# Patient Record
Sex: Male | Born: 1935 | Race: Black or African American | Hispanic: No | Marital: Married | Smoking: Never smoker
Health system: Southern US, Community
[De-identification: ages and names within clinical notes are randomized; demographics above are authoritative.]

## PROBLEM LIST (undated history)

## (undated) DIAGNOSIS — E119 Type 2 diabetes mellitus without complications: Secondary | ICD-10-CM

## (undated) DIAGNOSIS — I10 Essential (primary) hypertension: Secondary | ICD-10-CM

## (undated) DIAGNOSIS — E7800 Pure hypercholesterolemia, unspecified: Secondary | ICD-10-CM

## (undated) DIAGNOSIS — I2510 Atherosclerotic heart disease of native coronary artery without angina pectoris: Secondary | ICD-10-CM

## (undated) HISTORY — PX: CARPAL TUNNEL RELEASE: SHX101

## (undated) HISTORY — PX: CORONARY ARTERY BYPASS GRAFT: SHX141

## (undated) HISTORY — PX: KNEE SURGERY: SHX244

## (undated) HISTORY — PX: CHOLECYSTECTOMY: SHX55

## (undated) HISTORY — PX: CAROTID ARTERY ANGIOPLASTY: SHX1300

---

## 2014-11-16 DIAGNOSIS — I259 Chronic ischemic heart disease, unspecified: Secondary | ICD-10-CM | POA: Diagnosis present

## 2015-01-28 DIAGNOSIS — Z953 Presence of xenogenic heart valve: Secondary | ICD-10-CM

## 2015-01-29 DIAGNOSIS — Z8673 Personal history of transient ischemic attack (TIA), and cerebral infarction without residual deficits: Secondary | ICD-10-CM

## 2015-03-04 DIAGNOSIS — E119 Type 2 diabetes mellitus without complications: Secondary | ICD-10-CM

## 2015-03-04 DIAGNOSIS — I10 Essential (primary) hypertension: Secondary | ICD-10-CM | POA: Diagnosis present

## 2017-10-22 DIAGNOSIS — G4733 Obstructive sleep apnea (adult) (pediatric): Secondary | ICD-10-CM | POA: Diagnosis present

## 2021-02-26 ENCOUNTER — Emergency Department (HOSPITAL_BASED_OUTPATIENT_CLINIC_OR_DEPARTMENT_OTHER): Payer: Medicare HMO

## 2021-02-26 ENCOUNTER — Encounter (HOSPITAL_BASED_OUTPATIENT_CLINIC_OR_DEPARTMENT_OTHER): Payer: Self-pay | Admitting: *Deleted

## 2021-02-26 ENCOUNTER — Other Ambulatory Visit: Payer: Self-pay

## 2021-02-26 ENCOUNTER — Emergency Department (HOSPITAL_BASED_OUTPATIENT_CLINIC_OR_DEPARTMENT_OTHER)
Admission: EM | Admit: 2021-02-26 | Discharge: 2021-02-26 | Disposition: A | Discharge status: Home / Self Care | Payer: Medicare HMO | Attending: Emergency Medicine | Admitting: Emergency Medicine

## 2021-02-26 DIAGNOSIS — J181 Lobar pneumonia, unspecified organism: Secondary | ICD-10-CM | POA: Insufficient documentation

## 2021-02-26 DIAGNOSIS — R519 Headache, unspecified: Secondary | ICD-10-CM | POA: Diagnosis not present

## 2021-02-26 DIAGNOSIS — I10 Essential (primary) hypertension: Secondary | ICD-10-CM | POA: Insufficient documentation

## 2021-02-26 DIAGNOSIS — Z79899 Other long term (current) drug therapy: Secondary | ICD-10-CM | POA: Insufficient documentation

## 2021-02-26 DIAGNOSIS — Z20822 Contact with and (suspected) exposure to covid-19: Secondary | ICD-10-CM | POA: Diagnosis not present

## 2021-02-26 DIAGNOSIS — Z7984 Long term (current) use of oral hypoglycemic drugs: Secondary | ICD-10-CM | POA: Diagnosis not present

## 2021-02-26 DIAGNOSIS — Z7982 Long term (current) use of aspirin: Secondary | ICD-10-CM | POA: Insufficient documentation

## 2021-02-26 DIAGNOSIS — E119 Type 2 diabetes mellitus without complications: Secondary | ICD-10-CM | POA: Insufficient documentation

## 2021-02-26 DIAGNOSIS — R030 Elevated blood-pressure reading, without diagnosis of hypertension: Secondary | ICD-10-CM | POA: Diagnosis present

## 2021-02-26 DIAGNOSIS — J189 Pneumonia, unspecified organism: Secondary | ICD-10-CM

## 2021-02-26 DIAGNOSIS — Z955 Presence of coronary angioplasty implant and graft: Secondary | ICD-10-CM | POA: Insufficient documentation

## 2021-02-26 DIAGNOSIS — R051 Acute cough: Secondary | ICD-10-CM

## 2021-02-26 HISTORY — DX: Pure hypercholesterolemia, unspecified: E78.00

## 2021-02-26 HISTORY — DX: Essential (primary) hypertension: I10

## 2021-02-26 HISTORY — DX: Type 2 diabetes mellitus without complications: E11.9

## 2021-02-26 LAB — CBC WITH DIFFERENTIAL/PLATELET
Abs Immature Granulocytes: 0.04 10*3/uL (ref 0.00–0.07)
Basophils Absolute: 0.1 10*3/uL (ref 0.0–0.1)
Basophils Relative: 0 %
Eosinophils Absolute: 0.2 10*3/uL (ref 0.0–0.5)
Eosinophils Relative: 2 %
HCT: 39.9 % (ref 39.0–52.0)
Hemoglobin: 13.2 g/dL (ref 13.0–17.0)
Immature Granulocytes: 0 %
Lymphocytes Relative: 16 %
Lymphs Abs: 1.9 10*3/uL (ref 0.7–4.0)
MCH: 32.1 pg (ref 26.0–34.0)
MCHC: 33.1 g/dL (ref 30.0–36.0)
MCV: 97.1 fL (ref 80.0–100.0)
Monocytes Absolute: 1 10*3/uL (ref 0.1–1.0)
Monocytes Relative: 9 %
Neutro Abs: 8.4 10*3/uL — ABNORMAL HIGH (ref 1.7–7.7)
Neutrophils Relative %: 73 %
Platelets: 134 10*3/uL — ABNORMAL LOW (ref 150–400)
RBC: 4.11 MIL/uL — ABNORMAL LOW (ref 4.22–5.81)
RDW: 14.2 % (ref 11.5–15.5)
WBC: 11.6 10*3/uL — ABNORMAL HIGH (ref 4.0–10.5)
nRBC: 0 % (ref 0.0–0.2)

## 2021-02-26 LAB — COMPREHENSIVE METABOLIC PANEL
ALT: 11 U/L (ref 0–44)
AST: 23 U/L (ref 15–41)
Albumin: 3.8 g/dL (ref 3.5–5.0)
Alkaline Phosphatase: 58 U/L (ref 38–126)
Anion gap: 7 (ref 5–15)
BUN: 16 mg/dL (ref 8–23)
CO2: 26 mmol/L (ref 22–32)
Calcium: 9 mg/dL (ref 8.9–10.3)
Chloride: 104 mmol/L (ref 98–111)
Creatinine, Ser: 1.26 mg/dL — ABNORMAL HIGH (ref 0.61–1.24)
GFR, Estimated: 54 mL/min — ABNORMAL LOW (ref >60)
Glucose, Bld: 91 mg/dL (ref 70–99)
Potassium: 4 mmol/L (ref 3.5–5.1)
Sodium: 137 mmol/L (ref 135–145)
Total Bilirubin: 0.5 mg/dL (ref 0.3–1.2)
Total Protein: 7 g/dL (ref 6.5–8.1)

## 2021-02-26 LAB — RESP PANEL BY RT-PCR (FLU A&B, COVID) ARPGX2
Influenza A by PCR: NEGATIVE
Influenza B by PCR: NEGATIVE
SARS Coronavirus 2 by RT PCR: NEGATIVE

## 2021-02-26 LAB — TROPONIN I (HIGH SENSITIVITY): Troponin I (High Sensitivity): 10 ng/L (ref <18)

## 2021-02-26 MED ORDER — AZITHROMYCIN 250 MG PO TABS: 250.0000 mg | ORAL_TABLET | Freq: Every day | ORAL | 0 refills | Status: DC

## 2021-02-26 MED ORDER — AMOXICILLIN-POT CLAVULANATE 875-125 MG PO TABS: 1.0000 | ORAL_TABLET | Freq: Two times a day (BID) | ORAL | 0 refills | Status: DC

## 2021-02-26 MED ORDER — AMOXICILLIN-POT CLAVULANATE 875-125 MG PO TABS
1.0000 | ORAL_TABLET | Freq: Once | ORAL | Status: AC
  Administered 2021-02-26: 1 via ORAL
  Filled 2021-02-26: qty 1

## 2021-02-26 MED ORDER — BENZONATATE 100 MG PO CAPS: 100.0000 mg | ORAL_CAPSULE | Freq: Three times a day (TID) | ORAL | 0 refills | Status: DC

## 2021-02-26 MED ORDER — AZITHROMYCIN 250 MG PO TABS
500.0000 mg | ORAL_TABLET | Freq: Once | ORAL | Status: AC
  Administered 2021-02-26: 500 mg via ORAL
  Filled 2021-02-26: qty 2

## 2021-02-26 NOTE — ED Triage Notes (Signed)
He woke with a headache this am. His wife gave him aspirin 325mg  with some relief. This afternoon his head started to hurt again. She took his BP and it was elevated. He had a change in his BP medication swelling of his lips 3 months ago and was never started on a new medication.

## 2021-02-26 NOTE — ED Provider Notes (Signed)
[facility] Provider Note   CSN: [id number] Arrival date & time: 02/26/21  1925     History Chief Complaint  Patient presents with   Hypertension    Norman Hart is a 87 y.o. male.  HPI     Headache began this morning, woke up with it, both temples, 6/10, dull Just had total knee replacement, using cane Denies numbness, weakness, difficulty talking or walking, visual changes or facial droop.  No nausea, vomiting No fever Stopped lisinopril a few months ago, did not restart anything else  Past Medical History:  Diagnosis Date   Diabetes mellitus without complication (HCC)    High cholesterol    Hypertension     There are no problems to display for this patient.   Past Surgical History:  Procedure Laterality Date   CAROTID ARTERY ANGIOPLASTY     CARPAL TUNNEL RELEASE     CORONARY ARTERY BYPASS GRAFT     KNEE SURGERY         No family history on file.  Social History   Tobacco Use   Smoking status: Never   Smokeless tobacco: Never  Substance Use Topics   Alcohol use: Never   Drug use: Never    Home Medications Prior to Admission medications   Medication Sig Start Date End Date Taking? Authorizing Provider  albuterol (VENTOLIN HFA) 108 (90 Base) MCG/ACT inhaler Inhale 2 puffs into the lungs every 4 (four) hours as needed. 12/02/19  Yes [provider]  amoxicillin-clavulanate (AUGMENTIN) 875-125 MG tablet Take 1 tablet by mouth every 12 (twelve) hours. 02/26/21  Yes Gareth Morgan, MD  aspirin 325 MG EC tablet Take by mouth. 12/20/20  Yes [provider]  azithromycin (ZITHROMAX) 250 MG tablet Take 1 tablet (250 mg total) by mouth daily. Take first 2 tablets together, then 1 every day until finished. 02/26/21  Yes Gareth Morgan, MD  benzonatate (TESSALON) 100 MG capsule Take 1 capsule (100 mg total) by mouth every 8 (eight) hours. 02/26/21  Yes Gareth Morgan, MD  ipratropium (ATROVENT) 0.06 % nasal  spray Place into the nose. 02/11/21 05/12/21 Yes [provider]  latanoprost (XALATAN) 0.005 % ophthalmic solution Place 1 drop into both eyes nightly. 07/13/20  Yes [provider]  metFORMIN (GLUCOPHAGE) 500 MG tablet TAKE 1 TABLET BY MOUTH TWICE DAILY FOR DIABETES 03/04/15  Yes [provider]  metoprolol tartrate (LOPRESSOR) 50 MG tablet Take by mouth. 07/24/20  Yes [provider]  rosuvastatin (CRESTOR) 40 MG tablet Take 1 tablet by mouth daily. 01/04/21  Yes [provider]    Allergies    Patient has no known allergies.  Review of Systems   Review of Systems  Constitutional:  Negative for fever.  HENT:  Positive for congestion and rhinorrhea. Negative for sore throat.   Eyes:  Negative for visual disturbance.  Respiratory:  Positive for cough. Negative for shortness of breath.   Cardiovascular:  Positive for chest pain (had heaviness on and off today, has had similar discomfor in the past, not worse with exertion or coughing, 4/10).  Gastrointestinal:  Negative for abdominal pain, diarrhea, nausea and vomiting.  Genitourinary:  Negative for dysuria.  Skin:  Negative for rash.  Neurological:  Positive for headaches. Negative for syncope, facial asymmetry, speech difficulty, weakness and numbness.   Physical Exam Updated Vital Signs BP (!) 161/71   Pulse 100   Temp 98.1 F (36.7 C) (Oral)   Resp 15   Ht 5\' 4"  (1.626  m)   Wt 68.5 kg   SpO2 100%   BMI 25.92 kg/m   Physical Exam Vitals and nursing note reviewed.  Constitutional:      General: He is not in acute distress.    Appearance: Normal appearance. He is well-developed. He is not ill-appearing or diaphoretic.  HENT:     Head: Normocephalic and atraumatic.  Eyes:     General: No visual field deficit.    Extraocular Movements: Extraocular movements intact.     Conjunctiva/sclera: Conjunctivae normal.     Pupils: Pupils are equal, round, and reactive to light.   Cardiovascular:     Rate and Rhythm: Normal rate and regular rhythm.     Pulses: Normal pulses.     Heart sounds: Normal heart sounds. No murmur heard.   No friction rub. No gallop.  Pulmonary:     Effort: Pulmonary effort is normal. No respiratory distress.     Breath sounds: Normal breath sounds. No wheezing or rales.  Abdominal:     General: There is no distension.     Palpations: Abdomen is soft.     Tenderness: There is no abdominal tenderness. There is no guarding.  Musculoskeletal:        General: No swelling or tenderness.     Cervical back: Normal range of motion.  Skin:    General: Skin is warm and dry.     Findings: No erythema or rash.  Neurological:     General: No focal deficit present.     Mental Status: He is alert and oriented to person, place, and time.     GCS: GCS eye subscore is 4. GCS verbal subscore is 5. GCS motor subscore is 6.     Cranial Nerves: No cranial nerve deficit, dysarthria or facial asymmetry.     Sensory: No sensory deficit.     Motor: No weakness or tremor.     Coordination: Coordination normal. Finger-Nose-Finger Test normal.     Gait: Gait normal.    ED Results / Procedures / Treatments   Labs (all labs ordered are listed, but only abnormal results are displayed) Labs Reviewed  CBC WITH DIFFERENTIAL/PLATELET - Abnormal; Notable for the following components:      Result Value   WBC 11.6 (*)    RBC 4.11 (*)    Platelets 134 (*)    Neutro Abs 8.4 (*)    All other components within normal limits  COMPREHENSIVE METABOLIC PANEL - Abnormal; Notable for the following components:   Creatinine, Ser 1.26 (*)    GFR, Estimated 54 (*)    All other components within normal limits  RESP PANEL BY RT-PCR (FLU A&B, COVID) ARPGX2  TROPONIN I (HIGH SENSITIVITY)    EKG EKG Interpretation  Date/Time:  Tuesday February 26 2021 19:52:29 EST Ventricular Rate:  92 PR Interval:  181 QRS Duration: 77 QT Interval:  354 QTC Calculation: 438 R  Axis:   57 Text Interpretation: Sinus rhythm Abnormal R-wave progression, early transition Consider left ventricular hypertrophy No previous ECGs available Confirmed by Alvira Monday ([id number]) on 02/26/2021 8:13:56 PM  Radiology CT Head Wo Contrast  Result Date: 02/26/2021 CLINICAL DATA:  Headaches for several hours, initial encounter EXAM: CT HEAD WITHOUT CONTRAST TECHNIQUE: Contiguous axial images were obtained from the base of the skull through the vertex without intravenous contrast. COMPARISON:  08/15/2015 FINDINGS: Brain: No evidence of acute infarction, hemorrhage, hydrocephalus, extra-axial collection or mass lesion/mass effect. Chronic atrophic and ischemic changes are noted commensurate with the  patient's given age. Vascular: No hyperdense vessel or unexpected calcification. Skull: Normal. Negative for fracture or focal lesion. Sinuses/Orbits: Sinuses are well aerated. Some mucosal thickening in the ethmoid and sphenoid sinuses is seen. No acute abnormality is noted. Other: None. IMPRESSION: Chronic atrophic and ischemic changes stable from the prior exam. Mucosal thickening in the paranasal sinuses without air-fluid level. Electronically Signed   By: Inez Catalina M.D.   On: 02/26/2021 21:15   DG Chest Portable 1 View  Result Date: 02/26/2021 CLINICAL DATA:  Cough and chest pain EXAM: PORTABLE CHEST 1 VIEW COMPARISON:  01/02/2021 FINDINGS: Unchanged chronic elevation of the left hemidiaphragm with increased left basilar atelectasis. Right lung is clear. Cardiomediastinal contours are normal. Remote median sternotomy. IMPRESSION: Chronic elevation of the left hemidiaphragm with increased left basilar atelectasis. Electronically Signed   By: Ulyses Jarred M.D.   On: 02/26/2021 21:06    Procedures Procedures   Medications Ordered in ED Medications  amoxicillin-clavulanate (AUGMENTIN) 875-125 MG per tablet 1 tablet (1 tablet Oral Given 02/26/21 2252)  azithromycin (ZITHROMAX) tablet 500 mg  (500 mg Oral Given 02/26/21 2252)    ED Course  I have reviewed the triage vital signs and the nursing notes.  Pertinent labs & imaging results that were available during my care of the patient were reviewed by me and considered in my medical decision making (see chart for details).    MDM Rules/Calculators/A&P                           87yo male with history of hypertension, DM, hyperlipidemia, presents with concern for elevated blood pressure, also reports headache, chest congestion and cough.  CT head without acute abnormalities.  Slow onset, low suspicion for occult SAH. Neurologic exam normal and doubt CVA.  Had CP earlier today, troponin negative, doubt ACS given timing of symptoms. Denies dyspnea, doubt PE. When reevaluated stated it was more the chest congestion and cough. Not having exertional symptoms.  Having more persistent coughing while in ED, WBC slightly elevated, XR with area that might represent atelectasis however given worsening cough I suspect it may be pneumonia. COVID/flu testing negative. Given rx for antibiotics and tessalon pearls. BP improved, do not suspect symptoms represent CHF/hypertensive emergency. Given concern for infection, will not initiate new BP medication at this time but recommend close follow up with PCP.  Patient discharged in stable condition with understanding of reasons to return.    Final Clinical Impression(s) / ED Diagnoses Final diagnoses:  Hypertension, unspecified type  Community acquired pneumonia of left lower lobe of lung  Acute cough    Rx / DC Orders ED Discharge Orders          Ordered    amoxicillin-clavulanate (AUGMENTIN) 875-125 MG tablet  Every 12 hours        02/26/21 2241    azithromycin (ZITHROMAX) 250 MG tablet  Daily        02/26/21 2241    benzonatate (TESSALON) 100 MG capsule  Every 8 hours        02/26/21 2243             Gareth Morgan, MD 02/27/21 2335

## 2021-06-08 ENCOUNTER — Other Ambulatory Visit: Payer: Self-pay

## 2021-06-08 ENCOUNTER — Emergency Department (HOSPITAL_BASED_OUTPATIENT_CLINIC_OR_DEPARTMENT_OTHER): Payer: Medicare HMO

## 2021-06-08 ENCOUNTER — Emergency Department (HOSPITAL_BASED_OUTPATIENT_CLINIC_OR_DEPARTMENT_OTHER)
Admission: EM | Admit: 2021-06-08 | Discharge: 2021-06-09 | Disposition: A | Discharge status: Home / Self Care | Payer: Medicare HMO | Attending: Emergency Medicine | Admitting: Emergency Medicine

## 2021-06-08 ENCOUNTER — Encounter (HOSPITAL_BASED_OUTPATIENT_CLINIC_OR_DEPARTMENT_OTHER): Payer: Self-pay | Admitting: Emergency Medicine

## 2021-06-08 DIAGNOSIS — J069 Acute upper respiratory infection, unspecified: Secondary | ICD-10-CM | POA: Diagnosis not present

## 2021-06-08 DIAGNOSIS — Z20822 Contact with and (suspected) exposure to covid-19: Secondary | ICD-10-CM | POA: Diagnosis not present

## 2021-06-08 DIAGNOSIS — E119 Type 2 diabetes mellitus without complications: Secondary | ICD-10-CM | POA: Diagnosis not present

## 2021-06-08 DIAGNOSIS — Z79899 Other long term (current) drug therapy: Secondary | ICD-10-CM | POA: Insufficient documentation

## 2021-06-08 DIAGNOSIS — Z7982 Long term (current) use of aspirin: Secondary | ICD-10-CM | POA: Insufficient documentation

## 2021-06-08 DIAGNOSIS — Z7984 Long term (current) use of oral hypoglycemic drugs: Secondary | ICD-10-CM | POA: Diagnosis not present

## 2021-06-08 DIAGNOSIS — I10 Essential (primary) hypertension: Secondary | ICD-10-CM | POA: Insufficient documentation

## 2021-06-08 DIAGNOSIS — I2510 Atherosclerotic heart disease of native coronary artery without angina pectoris: Secondary | ICD-10-CM | POA: Diagnosis not present

## 2021-06-08 DIAGNOSIS — R0981 Nasal congestion: Secondary | ICD-10-CM | POA: Diagnosis present

## 2021-06-08 HISTORY — DX: Atherosclerotic heart disease of native coronary artery without angina pectoris: I25.10

## 2021-06-08 LAB — RESP PANEL BY RT-PCR (FLU A&B, COVID) ARPGX2
Influenza A by PCR: NEGATIVE
Influenza B by PCR: NEGATIVE
SARS Coronavirus 2 by RT PCR: NEGATIVE

## 2021-06-08 LAB — CBC
HCT: 41.8 % (ref 39.0–52.0)
Hemoglobin: 13.8 g/dL (ref 13.0–17.0)
MCH: 31.2 pg (ref 26.0–34.0)
MCHC: 33 g/dL (ref 30.0–36.0)
MCV: 94.6 fL (ref 80.0–100.0)
Platelets: 102 10*3/uL — ABNORMAL LOW (ref 150–400)
RBC: 4.42 MIL/uL (ref 4.22–5.81)
RDW: 13.6 % (ref 11.5–15.5)
WBC: 9.8 10*3/uL (ref 4.0–10.5)
nRBC: 0 % (ref 0.0–0.2)

## 2021-06-08 LAB — BASIC METABOLIC PANEL
Anion gap: 5 (ref 5–15)
BUN: 14 mg/dL (ref 8–23)
CO2: 28 mmol/L (ref 22–32)
Calcium: 8.9 mg/dL (ref 8.9–10.3)
Chloride: 105 mmol/L (ref 98–111)
Creatinine, Ser: 1.37 mg/dL — ABNORMAL HIGH (ref 0.61–1.24)
GFR, Estimated: 49 mL/min — ABNORMAL LOW (ref >60)
Glucose, Bld: 97 mg/dL (ref 70–99)
Potassium: 4.2 mmol/L (ref 3.5–5.1)
Sodium: 138 mmol/L (ref 135–145)

## 2021-06-08 MED ORDER — ACETAMINOPHEN 325 MG PO TABS
650.0000 mg | ORAL_TABLET | Freq: Once | ORAL | Status: AC
  Administered 2021-06-08: 650 mg via ORAL
  Filled 2021-06-08: qty 2

## 2021-06-08 NOTE — ED Provider Notes (Signed)
[facility] EM[facility]GENCY DEPARTMENT Provider Note   CSN: [id number] Arrival date & time: 06/08/21  2225     History  Chief Complaint  Patient presents with   URI    Norman Hart is a 88 y.o. male.   URI Presenting symptoms: congestion    Patient has a history of coronary artery disease, diabetes, hypercholesterol and hypertension.  Patient presents to the ED with complaints of URI type symptoms that started today.  Patient states he has had headache and runny nose.  He has been feeling somewhat short of breath.  He has also had chills although has not measured any fever.  Tried taking some DayQuil at home.  His wife took his blood pressure and it was very elevated.  He is also had a headache and with the elevated blood pressure they decided come to the ED to be evaluated.  Patient denies any leg swelling.  No abdominal pain.  No vomiting or diarrhea.  Home Medications Prior to Admission medications   Medication Sig Start Date End Date Taking? Authorizing Provider  albuterol (VENTOLIN HFA) 108 (90 Base) MCG/ACT inhaler Inhale 2 puffs into the lungs every 4 (four) hours as needed. 12/02/19   [provider]  amoxicillin-clavulanate (AUGMENTIN) 875-125 MG tablet Take 1 tablet by mouth every 12 (twelve) hours. 02/26/21   Alvira Monday, MD  aspirin 325 MG EC tablet Take by mouth. 12/20/20   [provider]  azithromycin (ZITHROMAX) 250 MG tablet Take 1 tablet (250 mg total) by mouth daily. Take first 2 tablets together, then 1 every day until finished. 02/26/21   Alvira Monday, MD  benzonatate (TESSALON) 100 MG capsule Take 1 capsule (100 mg total) by mouth every 8 (eight) hours. 02/26/21   Alvira Monday, MD  ipratropium (ATROVENT) 0.06 % nasal spray Place into the nose. 02/11/21 05/12/21  [provider]  latanoprost (XALATAN) 0.005 % ophthalmic solution Place 1 drop into both eyes nightly. 07/13/20   [provider]  metFORMIN (GLUCOPHAGE) 500  MG tablet TAKE 1 TABLET BY MOUTH TWICE DAILY FOR DIABETES 03/04/15   [provider]  metoprolol tartrate (LOPRESSOR) 50 MG tablet Take by mouth. 07/24/20   [provider]  rosuvastatin (CRESTOR) 40 MG tablet Take 1 tablet by mouth daily. 01/04/21   [provider]      Allergies    Ivp dye [iodinated contrast media]    Review of Systems   Review of Systems  HENT:  Positive for congestion.   Respiratory:  Positive for shortness of breath.   Cardiovascular:  Negative for chest pain.   Physical Exam Updated Vital Signs BP (!) 164/78 (BP Location: Right Arm)    Pulse (!) 109    Temp 98.1 F (36.7 C)    Resp (!) 24    Ht 1.626 m (5\' 4" )    Wt 69.9 kg    SpO2 100%    BMI 26.43 kg/m  Physical Exam Vitals and nursing note reviewed.  Constitutional:      Appearance: He is well-developed. He is not diaphoretic.  HENT:     Head: Normocephalic and atraumatic.     Right Ear: External ear normal.     Left Ear: External ear normal.  Eyes:     General: No scleral icterus.       Right eye: No discharge.        Left eye: No discharge.     Conjunctiva/sclera: Conjunctivae normal.  Neck:     Trachea: No  tracheal deviation.  Cardiovascular:     Rate and Rhythm: Normal rate and regular rhythm.  Pulmonary:     Effort: Pulmonary effort is normal. No respiratory distress.     Breath sounds: Normal breath sounds. No stridor. No wheezing or rales.  Abdominal:     General: Bowel sounds are normal. There is no distension.     Palpations: Abdomen is soft.     Tenderness: There is no abdominal tenderness. There is no guarding or rebound.  Musculoskeletal:        General: No tenderness or deformity.     Cervical back: Neck supple.  Skin:    General: Skin is warm and dry.     Findings: No rash.  Neurological:     General: No focal deficit present.     Mental Status: He is alert.     Cranial Nerves: No cranial nerve deficit (no facial droop, extraocular movements intact,  no slurred speech).     Sensory: No sensory deficit.     Motor: No abnormal muscle tone or seizure activity.     Coordination: Coordination normal.  Psychiatric:        Mood and Affect: Mood normal.    ED Results / Procedures / Treatments   Labs (all labs ordered are listed, but only abnormal results are displayed) Labs Reviewed  RESP PANEL BY RT-PCR (FLU A&B, COVID) ARPGX2  CBC  BASIC METABOLIC PANEL    EKG None  Radiology No results found.  Procedures Procedures    Medications Ordered in ED Medications  acetaminophen (TYLENOL) tablet 650 mg (650 mg Oral Given 06/08/21 2300)    ED Course/ Medical Decision Making/ A&P                           Medical Decision Making Amount and/or Complexity of Data Reviewed Labs: ordered. Radiology: ordered.  Risk OTC drugs.  Will check labs, xray, covid test.   No meningismus.  No sudden onset to suggest vascular etiology. BP elevated at this time but doubt contributing to his headache. Care turned over to Dr Jacqulyn Bath pending tests results.        Final Clinical Impression(s) / ED Diagnoses Final diagnoses:  URI (upper respiratory infection)    Rx / DC Orders ED Discharge Orders     None         Linwood Dibbles, MD 06/08/21 2320

## 2021-06-08 NOTE — ED Triage Notes (Signed)
Pt reports HA, runny nose, SHOB, and chills that started today. Pt taking Dayquil for his sx. Visitor reports HTN at home. BP 164/78 at triage. Rates HA 8/10. Denies fever.

## 2021-06-09 NOTE — ED Provider Notes (Signed)
Blood pressure (!) 159/75, pulse (!) 105, temperature 98.1 F (36.7 C), resp. rate (!) 24, height 5\' 4"  (1.626 m), weight 69.9 kg, SpO2 99 %.  Assuming care from Dr. .  In short, Norman Hart is a 88 y.o. male with a chief complaint of URI .  Refer to the original H&P for additional details.  The current plan of care is to follow up with labs and reassess.  12:07 AM Patient's lab work reviewed.  He has some mild CKD which seems similar to his prior values.  No leukocytosis or anemia.  COVID and flu PCR testing is negative.  I independently interpreted the chest x-ray and agree with radiology interpretation of no active disease.   Patient does have some mild tachycardia as well as elevated blood pressures.  Considered alternate etiologies for symptom such as PE or developing sepsis/severe bacterial infection, however, patient has been taking DayQuil and I suspect this medication may be causing the vital sign abnormalities.  He is looking very well.  No increased work of breathing or confusion.  Feel patient is safe for discharge.  Advised him against using DayQuil and other daytime formulations of cough/cold medicines. Plan for supportive care and PCP follow up in the coming week.     95, MD 06/09/21 [phone]

## 2021-06-09 NOTE — ED Notes (Signed)
Patient discharged to home.  All discharge instructions reviewed.  Patient verbalized understanding via teachback method.  VS WDL.  Respirations even and unlabored.  Wheelchair out of ED.   ?

## 2021-06-09 NOTE — Discharge Instructions (Signed)
You were seen in the emergency department today with upper respiratory infection symptoms.  Your lab work here is reassuring.  Your COVID and flu test are negative.  Your blood work is largely normal.  Your chest x-ray did not show pneumonia.  I would have you avoid using over the counter cold/flu medicines marketed as "daytime" medicine as this often has ingredients that can raise your blood pressure and heart rate.  You may ask the pharmacist to point in the direction of medicines which are safe to take with high blood pressure.  Please follow with your primary care doctor and return with any new or suddenly worsening symptoms.

## 2022-02-08 ENCOUNTER — Other Ambulatory Visit: Payer: Self-pay

## 2022-02-08 ENCOUNTER — Observation Stay (HOSPITAL_BASED_OUTPATIENT_CLINIC_OR_DEPARTMENT_OTHER)
Admission: EM | Admit: 2022-02-08 | Discharge: 2022-02-10 | Disposition: A | Discharge status: Home / Self Care | Payer: Medicare HMO | Attending: Family Medicine | Admitting: Family Medicine

## 2022-02-08 ENCOUNTER — Emergency Department (HOSPITAL_BASED_OUTPATIENT_CLINIC_OR_DEPARTMENT_OTHER): Payer: Medicare HMO

## 2022-02-08 ENCOUNTER — Encounter (HOSPITAL_BASED_OUTPATIENT_CLINIC_OR_DEPARTMENT_OTHER): Payer: Self-pay | Admitting: Emergency Medicine

## 2022-02-08 DIAGNOSIS — I2510 Atherosclerotic heart disease of native coronary artery without angina pectoris: Secondary | ICD-10-CM

## 2022-02-08 DIAGNOSIS — Z951 Presence of aortocoronary bypass graft: Secondary | ICD-10-CM | POA: Insufficient documentation

## 2022-02-08 DIAGNOSIS — Z7984 Long term (current) use of oral hypoglycemic drugs: Secondary | ICD-10-CM | POA: Diagnosis not present

## 2022-02-08 DIAGNOSIS — I5022 Chronic systolic (congestive) heart failure: Secondary | ICD-10-CM | POA: Diagnosis not present

## 2022-02-08 DIAGNOSIS — Z8673 Personal history of transient ischemic attack (TIA), and cerebral infarction without residual deficits: Secondary | ICD-10-CM | POA: Diagnosis not present

## 2022-02-08 DIAGNOSIS — I130 Hypertensive heart and chronic kidney disease with heart failure and stage 1 through stage 4 chronic kidney disease, or unspecified chronic kidney disease: Secondary | ICD-10-CM | POA: Diagnosis not present

## 2022-02-08 DIAGNOSIS — I255 Ischemic cardiomyopathy: Secondary | ICD-10-CM | POA: Diagnosis not present

## 2022-02-08 DIAGNOSIS — R079 Chest pain, unspecified: Secondary | ICD-10-CM | POA: Diagnosis present

## 2022-02-08 DIAGNOSIS — E1122 Type 2 diabetes mellitus with diabetic chronic kidney disease: Secondary | ICD-10-CM | POA: Diagnosis not present

## 2022-02-08 DIAGNOSIS — I700 Atherosclerosis of aorta: Secondary | ICD-10-CM | POA: Insufficient documentation

## 2022-02-08 DIAGNOSIS — Z953 Presence of xenogenic heart valve: Secondary | ICD-10-CM

## 2022-02-08 DIAGNOSIS — E785 Hyperlipidemia, unspecified: Secondary | ICD-10-CM | POA: Diagnosis present

## 2022-02-08 DIAGNOSIS — Z79899 Other long term (current) drug therapy: Secondary | ICD-10-CM | POA: Diagnosis not present

## 2022-02-08 DIAGNOSIS — N400 Enlarged prostate without lower urinary tract symptoms: Secondary | ICD-10-CM | POA: Diagnosis present

## 2022-02-08 DIAGNOSIS — E119 Type 2 diabetes mellitus without complications: Secondary | ICD-10-CM

## 2022-02-08 DIAGNOSIS — I259 Chronic ischemic heart disease, unspecified: Secondary | ICD-10-CM | POA: Diagnosis present

## 2022-02-08 DIAGNOSIS — Z7982 Long term (current) use of aspirin: Secondary | ICD-10-CM | POA: Insufficient documentation

## 2022-02-08 DIAGNOSIS — Z66 Do not resuscitate: Secondary | ICD-10-CM | POA: Diagnosis not present

## 2022-02-08 DIAGNOSIS — N1832 Chronic kidney disease, stage 3b: Secondary | ICD-10-CM | POA: Insufficient documentation

## 2022-02-08 DIAGNOSIS — I6523 Occlusion and stenosis of bilateral carotid arteries: Secondary | ICD-10-CM | POA: Diagnosis not present

## 2022-02-08 DIAGNOSIS — I083 Combined rheumatic disorders of mitral, aortic and tricuspid valves: Secondary | ICD-10-CM | POA: Insufficient documentation

## 2022-02-08 DIAGNOSIS — G4733 Obstructive sleep apnea (adult) (pediatric): Secondary | ICD-10-CM | POA: Diagnosis not present

## 2022-02-08 DIAGNOSIS — I10 Essential (primary) hypertension: Secondary | ICD-10-CM | POA: Diagnosis present

## 2022-02-08 DIAGNOSIS — N183 Chronic kidney disease, stage 3 unspecified: Secondary | ICD-10-CM

## 2022-02-08 HISTORY — DX: Type 2 diabetes mellitus without complications: E11.9

## 2022-02-08 LAB — LIPASE, BLOOD: Lipase: 47 U/L (ref 11–51)

## 2022-02-08 LAB — CBC WITH DIFFERENTIAL/PLATELET
Abs Immature Granulocytes: 0.02 10*3/uL (ref 0.00–0.07)
Basophils Absolute: 0.1 10*3/uL (ref 0.0–0.1)
Basophils Relative: 1 %
Eosinophils Absolute: 0 10*3/uL (ref 0.0–0.5)
Eosinophils Relative: 0 %
HCT: 42.8 % (ref 39.0–52.0)
Hemoglobin: 14.2 g/dL (ref 13.0–17.0)
Immature Granulocytes: 0 %
Lymphocytes Relative: 23 %
Lymphs Abs: 1.7 10*3/uL (ref 0.7–4.0)
MCH: 31.6 pg (ref 26.0–34.0)
MCHC: 33.2 g/dL (ref 30.0–36.0)
MCV: 95.1 fL (ref 80.0–100.0)
Monocytes Absolute: 0.4 10*3/uL (ref 0.1–1.0)
Monocytes Relative: 6 %
Neutro Abs: 5.2 10*3/uL (ref 1.7–7.7)
Neutrophils Relative %: 70 %
Platelets: 105 10*3/uL — ABNORMAL LOW (ref 150–400)
RBC: 4.5 MIL/uL (ref 4.22–5.81)
RDW: 13.2 % (ref 11.5–15.5)
WBC: 7.3 10*3/uL (ref 4.0–10.5)
nRBC: 0 % (ref 0.0–0.2)

## 2022-02-08 LAB — COMPREHENSIVE METABOLIC PANEL
ALT: 14 U/L (ref 0–44)
AST: 26 U/L (ref 15–41)
Albumin: 3.9 g/dL (ref 3.5–5.0)
Alkaline Phosphatase: 52 U/L (ref 38–126)
Anion gap: 5 (ref 5–15)
BUN: 13 mg/dL (ref 8–23)
CO2: 27 mmol/L (ref 22–32)
Calcium: 8.9 mg/dL (ref 8.9–10.3)
Chloride: 106 mmol/L (ref 98–111)
Creatinine, Ser: 1.36 mg/dL — ABNORMAL HIGH (ref 0.61–1.24)
GFR, Estimated: 49 mL/min — ABNORMAL LOW (ref >60)
Glucose, Bld: 131 mg/dL — ABNORMAL HIGH (ref 70–99)
Potassium: 4.3 mmol/L (ref 3.5–5.1)
Sodium: 138 mmol/L (ref 135–145)
Total Bilirubin: 0.6 mg/dL (ref 0.3–1.2)
Total Protein: 7.3 g/dL (ref 6.5–8.1)

## 2022-02-08 LAB — TROPONIN I (HIGH SENSITIVITY): Troponin I (High Sensitivity): 16 ng/L (ref <18)

## 2022-02-08 NOTE — ED Triage Notes (Signed)
Pt POV with spouse-  Pt c/o intermittent chest pain since 1100 today. Pt c/o nausea, no emesis. Diaphoresis during episodes. Took 81 mg ASA appx 1100 today.

## 2022-02-08 NOTE — ED Notes (Signed)
X-ray at bedside

## 2022-02-08 NOTE — ED Provider Notes (Signed)
[place] [facility] Provider Note: Georgena Spurling, MD, FACEP  CSN: [id number] MRN: [id number] ARRIVAL: 02/08/22 at [place]: [id number]   CHIEF COMPLAINT  Chest Pain   HISTORY OF PRESENT ILLNESS  02/08/22 11:00 PM Norman Hart is a 88 y.o. male with a history of diabetes and known coronary artery disease.  He has had general malaise and weakness since about 10 AM today.  Since about 11 AM he has had intermittent chest pain.  The chest pain occurs in the center of his chest.  He describes it as an aching.  He rates it as a 5 out of 5.  Nothing brings it on and nothing makes it better or worse.  These episodes last about a minute each.  They are associated with shortness of breath, diaphoresis and nausea but no vomiting.   Past Medical History:  Diagnosis Date   Coronary artery disease    DM (diabetes mellitus) ([place])    High cholesterol    Hypertension     Past Surgical History:  Procedure Laterality Date   CAROTID ARTERY ANGIOPLASTY     CARPAL TUNNEL RELEASE     CORONARY ARTERY BYPASS GRAFT     KNEE SURGERY      No family history on file.  Social History   Tobacco Use   Smoking status: Never   Smokeless tobacco: Never  Vaping Use   Vaping Use: Never used  Substance Use Topics   Alcohol use: Never   Drug use: Never    Prior to Admission medications   Medication Sig Start Date End Date Taking? Authorizing Provider  albuterol (VENTOLIN HFA) 108 (90 Base) MCG/ACT inhaler Inhale 2 puffs into the lungs every 4 (four) hours as needed. 12/02/19   [provider]  amoxicillin-clavulanate (AUGMENTIN) 875-125 MG tablet Take 1 tablet by mouth every 12 (twelve) hours. 02/26/21   Gareth Morgan, MD  aspirin 325 MG EC tablet Take by mouth. 12/20/20   [provider]  azithromycin (ZITHROMAX) 250 MG tablet Take 1 tablet (250 mg total) by mouth daily. Take first 2 tablets together, then 1 every day until finished. 02/26/21   Gareth Morgan, MD  benzonatate  (TESSALON) 100 MG capsule Take 1 capsule (100 mg total) by mouth every 8 (eight) hours. 02/26/21   Gareth Morgan, MD  ipratropium (ATROVENT) 0.06 % nasal spray Place into the nose. 02/11/21 05/12/21  [provider]  latanoprost (XALATAN) 0.005 % ophthalmic solution Place 1 drop into both eyes nightly. 07/13/20   [provider]  metFORMIN (GLUCOPHAGE) 500 MG tablet TAKE 1 TABLET BY MOUTH TWICE DAILY FOR DIABETES 03/04/15   [provider]  metoprolol tartrate (LOPRESSOR) 50 MG tablet Take by mouth. 07/24/20   [provider]  rosuvastatin (CRESTOR) 40 MG tablet Take 1 tablet by mouth daily. 01/04/21   [provider]    Allergies Ivp dye [iodinated contrast media]   REVIEW OF SYSTEMS  Negative except as noted here or in the History of Present Illness.   PHYSICAL EXAMINATION  Initial Vital Signs Blood pressure (!) 200/80, pulse 86, temperature 98.7 F (37.1 C), temperature source Oral, resp. rate 17, height 5\' 5"  (1.651 m), weight 70.5 kg, SpO2 98 %.  Examination General: Well-developed, well-nourished male in no acute distress; appearance consistent with age of record HENT: normocephalic; atraumatic Eyes: pupils equal, round and reactive to light; extraocular muscles intact; arcus senilis bilaterally Neck: supple Heart: regular rate and rhythm Lungs: clear to auscultation bilaterally Abdomen: soft; nondistended; nontender;  bowel sounds present Extremities: No deformity; full range of motion; pulses normal Neurologic: Awake, alert and oriented; motor function intact in all extremities and symmetric; no facial droop Skin: Warm and dry Psychiatric: Normal mood and affect   RESULTS  Summary of this visit's results, reviewed and interpreted by myself:   EKG Interpretation  Date/Time:  Saturday February 08 2022 22:29:36 EDT Ventricular Rate:  87 PR Interval:  111 QRS Duration: 79 QT Interval:  395 QTC Calculation: 476 R  Axis:   45 Text Interpretation: Sinus rhythm Borderline short PR interval Consider left ventricular hypertrophy Borderline prolonged QT interval Rate is slower Confirmed by Brooks Kinnan, Jenny Reichmann [phone]) on 02/08/2022 10:45:05 PM       Laboratory Studies: Results for orders placed or performed during the hospital encounter of 02/08/22 (from the past 24 hour(s))  CBC with Differential     Status: Abnormal   Collection Time: 02/08/22 10:37 PM  Result Value Ref Range   WBC 7.3 4.0 - 10.5 K/uL   RBC 4.50 4.22 - 5.81 MIL/uL   Hemoglobin 14.2 13.0 - 17.0 g/dL   HCT 42.8 39.0 - 52.0 %   MCV 95.1 80.0 - 100.0 fL   MCH 31.6 26.0 - 34.0 pg   MCHC 33.2 30.0 - 36.0 g/dL   RDW 13.2 11.5 - 15.5 %   Platelets 105 (L) 150 - 400 K/uL   nRBC 0.0 0.0 - 0.2 %   Neutrophils Relative % 70 %   Neutro Abs 5.2 1.7 - 7.7 K/uL   Lymphocytes Relative 23 %   Lymphs Abs 1.7 0.7 - 4.0 K/uL   Monocytes Relative 6 %   Monocytes Absolute 0.4 0.1 - 1.0 K/uL   Eosinophils Relative 0 %   Eosinophils Absolute 0.0 0.0 - 0.5 K/uL   Basophils Relative 1 %   Basophils Absolute 0.1 0.0 - 0.1 K/uL   Immature Granulocytes 0 %   Abs Immature Granulocytes 0.02 0.00 - 0.07 K/uL  Comprehensive metabolic panel     Status: Abnormal   Collection Time: 02/08/22 10:37 PM  Result Value Ref Range   Sodium 138 135 - 145 mmol/L   Potassium 4.3 3.5 - 5.1 mmol/L   Chloride 106 98 - 111 mmol/L   CO2 27 22 - 32 mmol/L   Glucose, Bld 131 (H) 70 - 99 mg/dL   BUN 13 8 - 23 mg/dL   Creatinine, Ser 1.36 (H) 0.61 - 1.24 mg/dL   Calcium 8.9 8.9 - 10.3 mg/dL   Total Protein 7.3 6.5 - 8.1 g/dL   Albumin 3.9 3.5 - 5.0 g/dL   AST 26 15 - 41 U/L   ALT 14 0 - 44 U/L   Alkaline Phosphatase 52 38 - 126 U/L   Total Bilirubin 0.6 0.3 - 1.2 mg/dL   GFR, Estimated 49 (L) >60 mL/min   Anion gap 5 5 - 15  Lipase, blood     Status: None   Collection Time: 02/08/22 10:37 PM  Result Value Ref Range   Lipase 47 11 - 51 U/L  Troponin I (High Sensitivity)      Status: None   Collection Time: 02/08/22 10:37 PM  Result Value Ref Range   Troponin I (High Sensitivity) 16 <18 ng/L  Troponin I (High Sensitivity)     Status: None   Collection Time: 02/09/22 12:34 AM  Result Value Ref Range   Troponin I (High Sensitivity) 16 <18 ng/L   Imaging Studies: DG Chest Portable 1 View  Result Date: 02/08/2022  CLINICAL DATA:  Chest pain, diaphoresis. EXAM: PORTABLE CHEST 1 VIEW COMPARISON:  06/08/2021. FINDINGS: The heart size and mediastinal contours are within normal limits. There is atherosclerotic calcification of the aorta. Chronic elevation of the left diaphragm is noted with atelectasis at the left lung base. There is mild blunting of the right costophrenic angle, possible small pleural effusion. No pneumothorax. Sternotomy wires are present over the midline. No acute osseous abnormality. IMPRESSION: 1. Elevation of the left diaphragm with atelectasis at the left lung base. 2. Blunting of the right costophrenic angle suggesting small pleural effusion. Electronically Signed   By: Brett Fairy M.D.   On: 02/08/2022 23:06    ED COURSE and MDM  Nursing notes, initial and subsequent vitals signs, including pulse oximetry, reviewed and interpreted by myself.  Vitals:   02/08/22 2330 02/09/22 0000 02/09/22 0030 02/09/22 0100  BP: (!) 187/77 (!) 178/80 (!) 189/75 (!) 173/72  Pulse: 85 86 84 87  Resp: 19 (!) 21 20 19   Temp:      TempSrc:      SpO2: 94% 97% 95% 94%  Weight:      Height:       Medications - No data to display  1:12 AM The patient's EKG and troponins are not consistent with acute coronary ischemia but his history of coronary artery disease and the nature of his chest pain this week is suggestive of unstable angina.  We will have him admitted [facility], as he a patient of that health system.  1:50 AM There are no beds available at [facility].  Patient would prefer to go to [facility] for [facility].  Dr. Marlowe Sax  accepts for admission to hospitalist service.   PROCEDURES  Procedures   ED DIAGNOSES     ICD-10-CM   1. Chest pain at rest  R07.9          Shanon Rosser, MD 02/09/22 [phone]

## 2022-02-08 NOTE — ED Notes (Signed)
EDP [place] made aware of pts elevated BP and cc.

## 2022-02-09 ENCOUNTER — Encounter (HOSPITAL_BASED_OUTPATIENT_CLINIC_OR_DEPARTMENT_OTHER): Payer: Self-pay | Admitting: Internal Medicine

## 2022-02-09 ENCOUNTER — Encounter (HOSPITAL_COMMUNITY): Payer: Self-pay

## 2022-02-09 DIAGNOSIS — R079 Chest pain, unspecified: Secondary | ICD-10-CM | POA: Diagnosis present

## 2022-02-09 DIAGNOSIS — N1832 Chronic kidney disease, stage 3b: Secondary | ICD-10-CM | POA: Diagnosis not present

## 2022-02-09 DIAGNOSIS — Z66 Do not resuscitate: Secondary | ICD-10-CM | POA: Diagnosis not present

## 2022-02-09 DIAGNOSIS — N183 Chronic kidney disease, stage 3 unspecified: Secondary | ICD-10-CM

## 2022-02-09 DIAGNOSIS — I2510 Atherosclerotic heart disease of native coronary artery without angina pectoris: Secondary | ICD-10-CM | POA: Diagnosis not present

## 2022-02-09 DIAGNOSIS — I6523 Occlusion and stenosis of bilateral carotid arteries: Secondary | ICD-10-CM | POA: Diagnosis not present

## 2022-02-09 DIAGNOSIS — I255 Ischemic cardiomyopathy: Secondary | ICD-10-CM | POA: Diagnosis not present

## 2022-02-09 DIAGNOSIS — Z7984 Long term (current) use of oral hypoglycemic drugs: Secondary | ICD-10-CM | POA: Diagnosis not present

## 2022-02-09 DIAGNOSIS — Z79899 Other long term (current) drug therapy: Secondary | ICD-10-CM | POA: Diagnosis not present

## 2022-02-09 DIAGNOSIS — E785 Hyperlipidemia, unspecified: Secondary | ICD-10-CM | POA: Diagnosis present

## 2022-02-09 DIAGNOSIS — Z7982 Long term (current) use of aspirin: Secondary | ICD-10-CM | POA: Diagnosis not present

## 2022-02-09 DIAGNOSIS — G4733 Obstructive sleep apnea (adult) (pediatric): Secondary | ICD-10-CM | POA: Diagnosis not present

## 2022-02-09 DIAGNOSIS — N400 Enlarged prostate without lower urinary tract symptoms: Secondary | ICD-10-CM | POA: Diagnosis present

## 2022-02-09 DIAGNOSIS — Z8673 Personal history of transient ischemic attack (TIA), and cerebral infarction without residual deficits: Secondary | ICD-10-CM | POA: Diagnosis not present

## 2022-02-09 DIAGNOSIS — Z953 Presence of xenogenic heart valve: Secondary | ICD-10-CM | POA: Diagnosis not present

## 2022-02-09 DIAGNOSIS — I083 Combined rheumatic disorders of mitral, aortic and tricuspid valves: Secondary | ICD-10-CM | POA: Diagnosis not present

## 2022-02-09 DIAGNOSIS — Z951 Presence of aortocoronary bypass graft: Secondary | ICD-10-CM | POA: Diagnosis not present

## 2022-02-09 DIAGNOSIS — I200 Unstable angina: Secondary | ICD-10-CM

## 2022-02-09 DIAGNOSIS — E1122 Type 2 diabetes mellitus with diabetic chronic kidney disease: Secondary | ICD-10-CM | POA: Diagnosis not present

## 2022-02-09 DIAGNOSIS — I5022 Chronic systolic (congestive) heart failure: Secondary | ICD-10-CM | POA: Diagnosis not present

## 2022-02-09 DIAGNOSIS — I700 Atherosclerosis of aorta: Secondary | ICD-10-CM | POA: Diagnosis not present

## 2022-02-09 DIAGNOSIS — R0789 Other chest pain: Secondary | ICD-10-CM | POA: Diagnosis not present

## 2022-02-09 DIAGNOSIS — I130 Hypertensive heart and chronic kidney disease with heart failure and stage 1 through stage 4 chronic kidney disease, or unspecified chronic kidney disease: Secondary | ICD-10-CM | POA: Diagnosis not present

## 2022-02-09 LAB — CBG MONITORING, ED: Glucose-Capillary: 116 mg/dL — ABNORMAL HIGH (ref 70–99)

## 2022-02-09 LAB — GLUCOSE, CAPILLARY
Glucose-Capillary: 156 mg/dL — ABNORMAL HIGH (ref 70–99)
Glucose-Capillary: 114 mg/dL — ABNORMAL HIGH (ref 70–99)
Glucose-Capillary: 147 mg/dL — ABNORMAL HIGH (ref 70–99)

## 2022-02-09 LAB — TROPONIN I (HIGH SENSITIVITY): Troponin I (High Sensitivity): 16 ng/L (ref <18)

## 2022-02-09 MED ORDER — SODIUM CHLORIDE 0.9 % IV SOLN: 250.0000 mL | INTRAVENOUS | Status: DC | PRN

## 2022-02-09 MED ORDER — ACETAMINOPHEN 650 MG RE SUPP: 650.0000 mg | Freq: Four times a day (QID) | RECTAL | Status: DC | PRN

## 2022-02-09 MED ORDER — ASPIRIN 81 MG PO CHEW
81.0000 mg | CHEWABLE_TABLET | Freq: Every day | ORAL | Status: DC
  Administered 2022-02-09 – 2022-02-10 (×2): 81 mg via ORAL
  Filled 2022-02-09 (×2): qty 1

## 2022-02-09 MED ORDER — ALBUTEROL SULFATE (2.5 MG/3ML) 0.083% IN NEBU: 3.0000 mL | INHALATION_SOLUTION | RESPIRATORY_TRACT | Status: DC | PRN

## 2022-02-09 MED ORDER — SODIUM CHLORIDE 0.9% FLUSH
3.0000 mL | Freq: Two times a day (BID) | INTRAVENOUS | Status: DC
  Administered 2022-02-09 – 2022-02-10 (×2): 3 mL via INTRAVENOUS

## 2022-02-09 MED ORDER — ACETAMINOPHEN 325 MG PO TABS: 650.0000 mg | ORAL_TABLET | Freq: Four times a day (QID) | ORAL | Status: DC | PRN

## 2022-02-09 MED ORDER — ENOXAPARIN SODIUM 40 MG/0.4ML IJ SOSY
40.0000 mg | PREFILLED_SYRINGE | INTRAMUSCULAR | Status: DC
  Administered 2022-02-09: 40 mg via SUBCUTANEOUS
  Filled 2022-02-09: qty 0.4

## 2022-02-09 MED ORDER — PANTOPRAZOLE SODIUM 40 MG PO TBEC
40.0000 mg | DELAYED_RELEASE_TABLET | Freq: Every day | ORAL | Status: DC
  Administered 2022-02-09 – 2022-02-10 (×2): 40 mg via ORAL
  Filled 2022-02-09 (×2): qty 1

## 2022-02-09 MED ORDER — INSULIN ASPART 100 UNIT/ML IJ SOLN
0.0000 [IU] | Freq: Three times a day (TID) | INTRAMUSCULAR | Status: DC
  Administered 2022-02-10: 2 [IU] via SUBCUTANEOUS

## 2022-02-09 MED ORDER — DONEPEZIL HCL 10 MG PO TABS
10.0000 mg | ORAL_TABLET | Freq: Every day | ORAL | Status: DC
  Administered 2022-02-09: 10 mg via ORAL
  Filled 2022-02-09: qty 1

## 2022-02-09 MED ORDER — METOPROLOL TARTRATE 50 MG PO TABS
50.0000 mg | ORAL_TABLET | Freq: Two times a day (BID) | ORAL | Status: DC
  Administered 2022-02-09: 50 mg via ORAL
  Filled 2022-02-09: qty 2

## 2022-02-09 MED ORDER — ROSUVASTATIN CALCIUM 40 MG PO TABS
40.0000 mg | ORAL_TABLET | Freq: Every day | ORAL | Status: DC
  Filled 2022-02-09: qty 1

## 2022-02-09 MED ORDER — NITROGLYCERIN 0.4 MG SL SUBL: 0.4000 mg | SUBLINGUAL_TABLET | SUBLINGUAL | Status: DC | PRN

## 2022-02-09 MED ORDER — SODIUM CHLORIDE 0.9% FLUSH: 3.0000 mL | INTRAVENOUS | Status: DC | PRN

## 2022-02-09 MED ORDER — CARVEDILOL 12.5 MG PO TABS
12.5000 mg | ORAL_TABLET | Freq: Two times a day (BID) | ORAL | Status: DC
  Administered 2022-02-10 (×2): 12.5 mg via ORAL
  Filled 2022-02-09 (×2): qty 1

## 2022-02-09 MED ORDER — ENOXAPARIN SODIUM 30 MG/0.3ML IJ SOSY: 30.0000 mg | PREFILLED_SYRINGE | INTRAMUSCULAR | Status: DC

## 2022-02-09 MED ORDER — LATANOPROST 0.005 % OP SOLN
1.0000 [drp] | Freq: Every day | OPHTHALMIC | Status: DC
  Filled 2022-02-09 (×2): qty 2.5

## 2022-02-09 NOTE — Assessment & Plan Note (Signed)
A1C in 08/2021: 6.4  Hold metformin SSI and accuchecks qac/hs

## 2022-02-09 NOTE — Plan of Care (Signed)
  Problem: Education: Goal: Knowledge of General Education information will improve Description: Including pain rating scale, medication(s)/side effects and non-pharmacologic comfort measures Outcome: Progressing   Problem: Health Behavior/Discharge Planning: Goal: Ability to manage health-related needs will improve Outcome: Progressing   Problem: Clinical Measurements: Goal: Ability to maintain clinical measurements within normal limits will improve Outcome: Progressing Goal: Will remain free from infection Outcome: Progressing Goal: Diagnostic test results will improve Outcome: Progressing Goal: Respiratory complications will improve Outcome: Progressing Goal: Cardiovascular complication will be avoided Outcome: Progressing   Problem: Activity: Goal: Risk for activity intolerance will decrease Outcome: Progressing   Problem: Nutrition: Goal: Adequate nutrition will be maintained Outcome: Progressing   Problem: Coping: Goal: Level of anxiety will decrease Outcome: Progressing   Problem: Elimination: Goal: Will not experience complications related to bowel motility Outcome: Progressing Goal: Will not experience complications related to urinary retention Outcome: Progressing   Problem: Pain Managment: Goal: General experience of comfort will improve Outcome: Progressing   Problem: Safety: Goal: Ability to remain free from injury will improve Outcome: Progressing   Problem: Skin Integrity: Goal: Risk for impaired skin integrity will decrease Outcome: Progressing   Problem: Coping: Goal: Ability to adjust to condition or change in health will improve Outcome: Progressing   Problem: Fluid Volume: Goal: Ability to maintain a balanced intake and output will improve Outcome: Progressing

## 2022-02-09 NOTE — Assessment & Plan Note (Addendum)
No longer wears cpap due to constant rhinorrhea, declines while inpatient

## 2022-02-09 NOTE — ED Notes (Signed)
ED Provider at bedside. 

## 2022-02-09 NOTE — Progress Notes (Signed)
Pt admitted to [facility], VS wnL and as per flow. Pt oriented to 6E processes. All questions and concerns addressed. Pt voiced having a lot of belching and stomach pain lately. Dr Rogers Blocker bedside. Call bell placed within reach, will continue to monitor and maintain safety.

## 2022-02-09 NOTE — Assessment & Plan Note (Signed)
Continue statin/ASA

## 2022-02-09 NOTE — Assessment & Plan Note (Signed)
Edwards 23 mm bioprosthetic valve performed 02/2003. TTE 2019 LV 55-60%. 15 mmHg mean gradient. Mild to Mod AR.  Repeat echo 05/09/2021 noted mild concentric LVH. LVEF 50-55%. Borderline global hypokinesis of the LV. Porcine aortic bioprosthesis valve noted. Moderate AR with AV mean pressure gradient of 21.

## 2022-02-09 NOTE — Assessment & Plan Note (Signed)
Lipid panel in 07/2020 LDL of 159, TC: 241 Continue crestor 40mg  Saw his cardiologist in September and they had long conversation about cholesterol not being to goal. Discussed zetia and PCSK9 inhibitors, but patient declined.

## 2022-02-09 NOTE — Progress Notes (Signed)
Patient asleep. Respirations even and unlabored. No acute distress to report.

## 2022-02-09 NOTE — Assessment & Plan Note (Addendum)
Above goal with AVR. Want <130/80 per cards note Continue home metoprolol and follow  History of hospitalization for medication induced hypotension, titrate slowly

## 2022-02-09 NOTE — ED Notes (Signed)
Report given to receiving nurse, Lennette Bihari at [facility] on 6E.

## 2022-02-09 NOTE — Plan of Care (Signed)
[facility] will assume care on arrival to accepting facility. Until arrival, care as per EDP. However, [facility] available 24/7 for questions and assistance.  Nursing staff, please page [facility] Admits and Consults ([phone]) as soon as the patient arrives to the hospital.   

## 2022-02-09 NOTE — Assessment & Plan Note (Signed)
Baseline creatinine of 1.2-1.3 Stable, continue to monitor

## 2022-02-09 NOTE — H&P (Addendum)
History and Physical    Patient: Norman Hart [id number] DOB: April 04, 1929 DOA: 02/08/2022 DOS: the patient was seen and examined on 02/09/2022 PCP: Osie Cheeks, PA-C  Patient coming from:  [facility]  - lives with his wife, daughter and 2 grandchildren. Ambulates independently.    Chief Complaint: chest pain   HPI: Norman Hart is a 88 y.o. male with medical history significant of CAD s/p 4-vessel CABG in 2004, HTN, HLD, T2DM, chronic ischemic HD, BPH, hx of CVA, hx of aortic valve replacement with porcine valve who presented to ED with complaints of chest pain that started on Tuesday.  He would have substernal chest pain that was heavy in nature with no radiation and would last 3 minutes.  He had associated diaphoresis and nausea, no shortness of breath. He states it was not worse with exertion. He states it was intermittent and ongoing since Tuesday and his wife made him to go to the ED on Saturday night.   Denies any fever/chills, vision changes, +headaches, no palpitations, shortness of breath or cough, abdominal pain, N/V/D, dysuria or leg swelling.    He does not smoke or drink alcohol.   ER Course:  vitals: afebrile, bp: 200/80, HR; 86, RR: 21, oxygen: 96%RA Pertinent labs: creatinine: 1.36 (1.2-1.3), troponin wnl x 2 CXR: elevation of left diaphragm with atelectasis at left lung base. Blunting of right costophrenic angle suggesting small pleural effusion.  In ED: history suggestive of unstable angina and [facility] asked to admit    Review of Systems: As mentioned in the history of present illness. All other systems reviewed and are negative. Past Medical History:  Diagnosis Date   Coronary artery disease    DM (diabetes mellitus) ([place])    High cholesterol    Hypertension    Past Surgical History:  Procedure Laterality Date   CAROTID ARTERY ANGIOPLASTY     CARPAL TUNNEL RELEASE     CORONARY ARTERY BYPASS GRAFT     KNEE SURGERY     Social History:  reports that he has never  smoked. He has never used smokeless tobacco. He reports that he does not drink alcohol and does not use drugs.  Allergies  Allergen Reactions   Ivp Dye [Iodinated Contrast Media]     Hives, lip swelling   Lisinopril Other (See Comments)    Angioedema (ALLERGY/intolerance)    History reviewed. No pertinent family history.  Prior to Admission medications   Medication Sig Start Date End Date Taking? Authorizing Provider  albuterol (VENTOLIN HFA) 108 (90 Base) MCG/ACT inhaler Inhale 2 puffs into the lungs every 4 (four) hours as needed. 12/02/19  Yes [provider]  Aspirin 81 MG CAPS Take 81 mg by mouth daily. 12/20/20  Yes [provider]  donepezil (ARICEPT) 5 MG tablet Take 5 mg by mouth at bedtime. 01/14/22  Yes [provider]  latanoprost (XALATAN) 0.005 % ophthalmic solution Place 1 drop into both eyes nightly. 07/13/20  Yes [provider]  metFORMIN (GLUCOPHAGE) 500 MG tablet TAKE 1 TABLET BY MOUTH TWICE DAILY FOR DIABETES 03/04/15  Yes [provider]  metoprolol tartrate (LOPRESSOR) 50 MG tablet Take 50 mg by mouth 2 (two) times daily. 07/24/20  Yes [provider]  rosuvastatin (CRESTOR) 40 MG tablet Take 1 tablet by mouth daily. 01/04/21  Yes [provider]  Alcohol Swabs (DROPSAFE ALCOHOL PREP) 70 % PADS Apply topically. 01/07/22   [provider]  amoxicillin-clavulanate (AUGMENTIN) 875-125 MG tablet Take 1 tablet by mouth every  12 (twelve) hours. 02/26/21   Gareth Morgan, MD  azithromycin (ZITHROMAX) 250 MG tablet Take 1 tablet (250 mg total) by mouth daily. Take first 2 tablets together, then 1 every day until finished. 02/26/21   Gareth Morgan, MD  benzonatate (TESSALON) 100 MG capsule Take 1 capsule (100 mg total) by mouth every 8 (eight) hours. 02/26/21   Gareth Morgan, MD  Blood Glucose Calibration (TRUE METRIX LEVEL 1) Low SOLN  01/07/22   [provider]  Blood Glucose Monitoring Suppl  (TRUE METRIX AIR GLUCOSE METER) w/Device KIT  01/07/22   [provider]  donepezil (ARICEPT) 10 MG tablet Take 10 mg by mouth at bedtime.    [provider]  ipratropium (ATROVENT) 0.06 % nasal spray Place into the nose. 02/11/21 05/12/21  [provider]  TRUE METRIX BLOOD GLUCOSE TEST test strip SMARTSIG:Via Meter 01/07/22   [provider]  TRUEplus Lancets 33G MISC  01/07/22   [provider]    Physical Exam: Vitals:   02/09/22 1230 02/09/22 1300 02/09/22 1330 02/09/22 1520  BP: (!) 145/74 (!) 159/86 (!) 170/60 (!) 147/74  Pulse: 63 80 (!) 59 81  Resp: _0 Temp:   98.1 F (36.7 C) 98.5 F (36.9 C)  TempSrc:   Oral Oral  SpO2: 99% 99% 100% 98%  Weight:    69.1 kg  Height:       General:  Appears calm and comfortable and is in NAD Eyes:  PERRL, EOMI, normal lids, iris ENT:  grossly normal hearing, lips & tongue, mmm; appropriate dentition Neck:  no LAD, masses or thyromegaly; no carotid bruits Cardiovascular:  RRR, +systolic murmur.  No LE edema.  Respiratory:   CTA bilaterally with no wheezes/rales/rhonchi.  Normal respiratory effort. Abdomen:  soft, NT, ND, NABS Back:   normal alignment, no CVAT Skin:  no rash or induration seen on limited exam Musculoskeletal:  grossly normal tone BUE/BLE, good ROM, no bony abnormality Lower extremity:  No LE edema.  Limited foot exam with no ulcerations.  2+ distal pulses. Psychiatric:  grossly normal mood and affect, speech fluent and appropriate, AOx3 Neurologic:  CN 2-12 grossly intact, moves all extremities in coordinated fashion, sensation intact   Radiological Exams on Admission: Independently reviewed - see discussion in A/P where applicable  DG Chest Portable 1 View  Result Date: 02/08/2022 CLINICAL DATA:  Chest pain, diaphoresis. EXAM: PORTABLE CHEST 1 VIEW COMPARISON:  06/08/2021. FINDINGS: The heart size and mediastinal contours are within normal limits. There is  atherosclerotic calcification of the aorta. Chronic elevation of the left diaphragm is noted with atelectasis at the left lung base. There is mild blunting of the right costophrenic angle, possible small pleural effusion. No pneumothorax. Sternotomy wires are present over the midline. No acute osseous abnormality. IMPRESSION: 1. Elevation of the left diaphragm with atelectasis at the left lung base. 2. Blunting of the right costophrenic angle suggesting small pleural effusion. Electronically Signed   By: Brett Fairy M.D.   On: 02/08/2022 23:06    EKG: Independently reviewed.  NSR with rate 87; nonspecific ST changes with no evidence of acute ischemia. Borderline prolonged qt    Labs on Admission: I have personally reviewed the available labs and imaging studies at the time of the admission.  Pertinent labs:   creatinine: 1.36 (1.2-1.3),  troponin wnl x 2  Assessment and Plan: Principal Problem:   Chest pain with history of 4 vessel CABG  Active Problems:   H/O aortic  valve replacement with porcine valve   CKD (chronic kidney disease) stage 3, GFR 30-59 ml/min (HCC)   Bilateral carotid artery stenosis   Essential hypertension   Type 2 diabetes mellitus (HCC)   Hyperlipidemia   History of CVA (cerebrovascular accident)   OSA (obstructive sleep apnea)   CAD (coronary artery disease)    Assessment and Plan: * Chest pain with history of 4 vessel CABG  88 year old presenting with history of intermittent, substernal chest pain with associated diaphoresis and nausea at rest with history of 4V CABG in 2004 (LIMA to LAD, constructed Y SVG to OM1, ICA, RCA) with AVR concerning for unstable angina -obs to telemetry -troponin wnl x 2 -currently chest pain free -ekg with no acute changes -check echo -lipid panel/a1c -cardiology consult -continue ASA, statin, metoprolol  -NG prn chest pain   H/O aortic valve replacement with porcine valve Edwards 23 mm bioprosthetic valve performed  02/2003. TTE 2019 LV 55-60%. 15 mmHg mean gradient. Mild to Mod AR.  Repeat echo 05/09/2021 noted mild concentric LVH. LVEF 50-55%. Borderline global hypokinesis of the LV. Porcine aortic bioprosthesis valve noted. Moderate AR with AV mean pressure gradient of 21.   CKD (chronic kidney disease) stage 3, GFR 30-59 ml/min (HCC) Baseline creatinine of 1.2-1.3 Stable, continue to monitor   Bilateral carotid artery stenosis History of left CEA by Dr. Maryjean Morn in 1998 Followed by vascular surgery  Continue ASA, statin   Essential hypertension Above goal with AVR. Want <130/80 per cards note Continue home metoprolol and follow  History of hospitalization for medication induced hypotension, titrate slowly   Type 2 diabetes mellitus ([place]) A1C in 08/2021: 6.4  Hold metformin SSI and accuchecks qac/hs   Hyperlipidemia Lipid panel in 07/2020 LDL of 159, TC: 241 Continue crestor 72m Saw his cardiologist in September and they had long conversation about cholesterol not being to goal. Discussed zetia and PCSK9 inhibitors, but patient declined.   History of CVA (cerebrovascular accident) Continue statin/ASA  OSA (obstructive sleep apnea) No longer wears cpap due to constant rhinorrhea, declines while inpatient     Advance Care Planning:   Code Status: DNR  Consults: cardiology  DVT Prophylaxis: lovenox   Family Communication: wife, son and duaghter in law at bedside   Severity of Illness: The appropriate patient status for this patient is OBSERVATION. Observation status is judged to be reasonable and necessary in order to provide the required intensity of service to ensure the patient's safety. The patient's presenting symptoms, physical exam findings, and initial radiographic and laboratory data in the context of their medical condition is felt to place them at decreased risk for further clinical deterioration. Furthermore, it is anticipated that the patient will be medically stable for  discharge from the hospital within 2 midnights of admission.   Author: AOrma Flaming MD 02/09/2022 5:01 PM  For on call review [email or web address]

## 2022-02-09 NOTE — Assessment & Plan Note (Addendum)
88 year old presenting with history of intermittent, substernal chest pain with associated diaphoresis and nausea at rest with history of 4V CABG in 2004 (LIMA to LAD, constructed Y SVG to OM1, ICA, RCA) with AVR concerning for unstable angina -obs to telemetry -troponin wnl x 2 -currently chest pain free -ekg with no acute changes -check echo -lipid panel/a1c -cardiology consult -continue ASA, statin, metoprolol  -NG prn chest pain

## 2022-02-09 NOTE — Consult Note (Signed)
Cardiology Consultation   Patient ID: Norman Hart MRN: [id number]; DOB: May 06, 1928  Admit date: 02/08/2022 Date of Consult: 02/09/2022  PCP:  Osie Cheeks, [place] Prov[facility] Cardiologist: [place]   Patient Profile:   Norman Hart is a 88 y.o. male with a hx of CAD (s/p CABG in 2004 with LIMA-LAD, Y graft to OM1 and Ramus and SVG-RCA), aortic stenosis (s/p AVR in 2004 with 23 mm Edwards bioprosthetic valve), carotid artery stenosis (s/p L CEA in 1998), HTN, HLD, Type 2 DM, Stage 3 CKD and prior CVA who is being seen 02/09/2022 for the evaluation of chest pain at the request of Dr. Rogers Blocker.  History of Present Illness:   Norman Hart is followed by Cardiology at [facility] with his most recent visit being in 12/2021. By review of their notes, he was overall doing well at that time and was continued on his current medical therapy with ASA 81 mg daily, Lopressor 25 mg twice daily and Crestor 40 mg daily. His LDL was elevated to 159 by recent labs but he was not interested in additional medication options at that time given his age.  He presented to [facility] on 02/08/2022 for evaluation of chest pain which started earlier in the day. He reported having intermittent chest discomfort for over 12 hours and the episodes would typically last for a minute each.  His BP was initially elevated at 200/80, improved to 147/74 on most recent check.  Initial labs showed WBC 7.3, Hgb 14.2, platelets 105, Na+ 138, K+ 4.3 and creatinine 1.36 (close to baseline). Initial and repeat troponin values negative at 16. CXR showed elevation of the left diaphragm with atelectasis at the left lung base and blunting of the right costophrenic angle suggesting a small pleural effusion. EKG showed normal sinus rhythm, heart rate 87 with LVH and no acute ST changes.  He was transferred to [facility] for further management. He has been continued on PTA ASA 75m daily, Lopressor 544m BID and Crestor 4092maily. Repeat echocardiogram currently pending.    His chest pain syndrome is unusual.  Is been going on for months and months with crescendo discomfort over the last month or so.  It is concerned his wife significantly and she is reported to the physician on number of occasions, he denies any symptoms, but on further pushing from his wife, he acknowledges a discomfort which she describes as heaviness in his chest which radiates to his neck arms bilaterally associate with diaphoresis shortness of breath.  Somewhat unusually, it occurs at rest both sitting and lying down, associated with the brackish taste.  It is not provoked by rather vigorous exercise including mowing the yards cleaning the garden etc.  He is also noted a change in the color and the odor of the stool  Past Medical History:  Diagnosis Date   Coronary artery disease    [place] (di[place] mellitus[place]  High cholesterol    Hypertension     Past Surgical History:  Procedure Laterality Date   CAROTID ARTERY ANGIOPLASTY     CARPAL TUNNEL RELEASE     CORONARY ARTERY BYPASS GRAFT     KNEE SURGERY       Home Medications:  Prior to Admission medications   Medication Sig Start Date End Date Taking? Authorizing Provider  Acetaminophen (TYLENOL) 325 MG CAPS Take 1 tablet by mouth every 6 (six) hours as needed (pain).   Yes [provider]  albuterol (VENTOLIN HFA) 108 (90 Base) MCG/ACT inhaler Inhale 2 puffs into the lungs every 4 (four) hours as needed. 12/02/19  Yes [provider]  Aspirin 81 MG CAPS Take 81 mg by mouth daily. 12/20/20  Yes [provider]  donepezil (ARICEPT) 10 MG tablet Take 10 mg by mouth at bedtime.   Yes [provider]  latanoprost (XALATAN) 0.005 % ophthalmic solution Place 1 drop into both eyes nightly. 07/13/20  Yes [provider]  metFORMIN (GLUCOPHAGE) 500 MG tablet Take 500 mg by mouth 2 (two) times daily with a meal. 03/04/15  Yes  [provider]  metoprolol tartrate (LOPRESSOR) 50 MG tablet Take 50 mg by mouth 2 (two) times daily. 07/24/20  Yes [provider]  rosuvastatin (CRESTOR) 40 MG tablet Take 1 tablet by mouth at bedtime. 01/04/21  Yes [provider]  Alcohol Swabs (DROPSAFE ALCOHOL PREP) 70 % PADS Apply topically. 01/07/22   [provider]  Blood Glucose Calibration (TRUE METRIX LEVEL 1) Low SOLN  01/07/22   [provider]  Blood Glucose Monitoring Suppl (TRUE METRIX AIR GLUCOSE METER) w/Device KIT  01/07/22   [provider]  ipratropium (ATROVENT) 0.06 % nasal spray Place into the nose. Patient not taking: Reported on 02/09/2022 02/11/21 05/12/21  [provider]  TRUE METRIX BLOOD GLUCOSE TEST test strip SMARTSIG:Via Meter 01/07/22   [provider]  TRUEplus Lancets 33G MISC  01/07/22   [provider]    Inpatient Medications: Scheduled Meds:  aspirin  81 mg Oral Daily   donepezil  10 mg Oral QHS   enoxaparin (LOVENOX) injection  30 mg Subcutaneous Q24H   insulin aspart  0-9 Units Subcutaneous TID WC   latanoprost  1 drop Both Eyes QHS   metoprolol tartrate  50 mg Oral BID   rosuvastatin  40 mg Oral Daily   sodium chloride flush  3 mL Intravenous Q12H   Continuous Infusions:  sodium chloride     PRN Meds: sodium chloride, acetaminophen **OR** acetaminophen, albuterol, nitroGLYCERIN, sodium chloride flush  Allergies:    Allergies  Allergen Reactions   Ivp Dye [Iodinated Contrast Media]     Hives, lip swelling   Lisinopril Other (See Comments)    Angioedema (ALLERGY/intolerance)    Social History:   Social History   Socioeconomic History   Marital status: Married    Spouse name: Not on file   Number of children: Not on file   Years of education: Not on file   Highest education level: Not on file  Occupational History   Not on file  Tobacco Use   Smoking status: Never   Smokeless tobacco: Never  Vaping  Use   Vaping Use: Never used  Substance and Sexual Activity   Alcohol use: Never   Drug use: Never   Sexual activity: Not on file  Other Topics Concern   Not on file  Social History Narrative   Not on file   Social Determinants of Health   Financial Resource Strain: Not on file  Food Insecurity: Not on file  Transportation Needs: Not on file  Physical Activity: Not on file  Stress: Not on file  Social Connections: Not on file  Intimate Partner Violence: Not on file    Family History:    Family History  Problem Relation Age of Onset   Heart failure Mother    Coronary artery disease Sister    Coronary artery disease Brother  ROS:  Please see the history of present illness.   All other ROS reviewed and negative.     Physical Exam/Data:   Vitals:   02/09/22 1230 02/09/22 1300 02/09/22 1330 02/09/22 1520  BP: (!) 145/74 (!) 159/86 (!) 170/60 (!) 147/74  Pulse: 63 80 (!) 59 81  Resp: _0 Temp:   98.1 F (36.7 C) 98.5 F (36.9 C)  TempSrc:   Oral Oral  SpO2: 99% 99% 100% 98%  Weight:    69.1 kg  Height:       No intake or output data in the 24 hours ending 02/09/22 1720    02/09/2022    3:20 PM 02/08/2022   10:32 PM 06/08/2021   10:41 PM  Last 3 Weights  Weight (lbs) 152 lb 4.8 oz 155 lb 6.4 oz 154 lb  Weight (kg) 69.083 kg 70.489 kg 69.854 kg     Body mass index is 25.34 kg/m.  General:  Well nourished, well developed, in no acute distress  HEENT: normal Neck: no JVD Vascular: No carotid bruits; Distal pulses 2+ bilaterally Cardiac:  normal S1, S2; RRR; 2/6 systolic murmur Lungs:  clear to auscultation bilaterally, no wheezing, rhonchi or rales  Abd: soft, nontender, no hepatomegaly  Ext: no edema Musculoskeletal:  No deformities, BUE and BLE strength normal and equal Skin: warm and dry  Neuro:  CNs 2-12 intact, no focal abnormalities noted Psych:  Normal affect   EKG:  The EKG was personally reviewed and demonstrates: NSR, HR 87 with  LVH and no acute ST changes.   Relevant CV Studies:  Echocardiogram: 04/2021 SUMMARY  There is mild concentric left ventricular hypertrophy.  Left ventricular systolic function is low normal.  LV ejection fraction = 50-55%.  There is borderline global hypokinesis of the left ventricle.  There is a porcine aortic bioprosthesis  There is moderate aortic regurgitation.  Aortic valve mean pressure gradient is 21 mmHg.  There is mild mitral annular calcification.  There is no comparison study available.   Laboratory Data:  High Sensitivity Troponin:   Recent Labs  Lab 02/08/22 2237 02/09/22 0034  TROPONINIHS 16 16     Chemistry Recent Labs  Lab 02/08/22 2237  NA 138  K 4.3  CL 106  CO2 27  GLUCOSE 131*  BUN 13  CREATININE 1.36*  CALCIUM 8.9  GFRNONAA 49*  ANIONGAP 5    Recent Labs  Lab 02/08/22 2237  PROT 7.3  ALBUMIN 3.9  AST 26  ALT 14  ALKPHOS 52  BILITOT 0.6   Lipids No results for input(s): "CHOL", "TRIG", "HDL", "LABVLDL", "LDLCALC", "CHOLHDL" in the last 168 hours.  Hematology Recent Labs  Lab 02/08/22 2237  WBC 7.3  RBC 4.50  HGB 14.2  HCT 42.8  MCV 95.1  MCH 31.6  MCHC 33.2  RDW 13.2  PLT 105*   Thyroid No results for input(s): "TSH", "FREET4" in the last 168 hours.  BNPNo results for input(s): "BNP", "PROBNP" in the last 168 hours.  DDimer No results for input(s): "DDIMER" in the last 168 hours.   Radiology/Studies:  DG Chest Portable 1 View  Result Date: 02/08/2022 CLINICAL DATA:  Chest pain, diaphoresis. EXAM: PORTABLE CHEST 1 VIEW COMPARISON:  06/08/2021. FINDINGS: The heart size and mediastinal contours are within normal limits. There is atherosclerotic calcification of the aorta. Chronic elevation of the left diaphragm is noted with atelectasis at the left lung base. There is mild blunting of the right costophrenic angle, possible small  pleural effusion. No pneumothorax. Sternotomy wires are present over the midline. No acute  osseous abnormality. IMPRESSION: 1. Elevation of the left diaphragm with atelectasis at the left lung base. 2. Blunting of the right costophrenic angle suggesting small pleural effusion. Electronically Signed   By: Brett Fairy M.D.   On: 02/08/2022 23:06     Assessment and Plan:   Chest pain with typical and atypical features suspect reflux CABG remotely with AVR Prior CVA  The patient's chest discomfort while with typical features of diaphoresis, radiation and some dyspnea is never provoked by exertion.  It has been ongoing for months although somewhat progressive of late.  It occurs at rest and with recumbency associated with a brackish taste and I suspect is reflux.  His troponins are negative despite episodes of pain that last tens of minutes.  Hence, I recommended that we undertake noninvasive evaluation as opposed to cath.  Will review specifics with imaging team  Begin PPI  Would use carvedilol for BP avoiding calcium blockers because of likely reflux and ACE/ARB secondary to angioedema   For questions or updates, please contact [place] Please consult [email or web address] for contact info under

## 2022-02-09 NOTE — Assessment & Plan Note (Signed)
History of left CEA by Dr. Maryjean Morn in 1998 Followed by vascular surgery  Continue ASA, statin

## 2022-02-10 ENCOUNTER — Observation Stay (HOSPITAL_BASED_OUTPATIENT_CLINIC_OR_DEPARTMENT_OTHER): Payer: Medicare HMO

## 2022-02-10 DIAGNOSIS — N1832 Chronic kidney disease, stage 3b: Secondary | ICD-10-CM

## 2022-02-10 DIAGNOSIS — G4733 Obstructive sleep apnea (adult) (pediatric): Secondary | ICD-10-CM

## 2022-02-10 DIAGNOSIS — I10 Essential (primary) hypertension: Secondary | ICD-10-CM

## 2022-02-10 DIAGNOSIS — I259 Chronic ischemic heart disease, unspecified: Secondary | ICD-10-CM

## 2022-02-10 DIAGNOSIS — R0789 Other chest pain: Secondary | ICD-10-CM

## 2022-02-10 DIAGNOSIS — I6523 Occlusion and stenosis of bilateral carotid arteries: Secondary | ICD-10-CM | POA: Diagnosis not present

## 2022-02-10 DIAGNOSIS — R079 Chest pain, unspecified: Secondary | ICD-10-CM

## 2022-02-10 DIAGNOSIS — E785 Hyperlipidemia, unspecified: Secondary | ICD-10-CM

## 2022-02-10 DIAGNOSIS — Z8673 Personal history of transient ischemic attack (TIA), and cerebral infarction without residual deficits: Secondary | ICD-10-CM

## 2022-02-10 DIAGNOSIS — Z953 Presence of xenogenic heart valve: Secondary | ICD-10-CM

## 2022-02-10 DIAGNOSIS — E119 Type 2 diabetes mellitus without complications: Secondary | ICD-10-CM

## 2022-02-10 LAB — NM MYOCAR MULTI W/SPECT W/WALL MOTION / EF
Base ST Depression (mm): 0 mm
LV dias vol: 109 mL (ref 62–150)
LV sys vol: 62 mL
Nuc Stress EF: 43 %
Peak HR: 90 {beats}/min
Rest HR: 85 {beats}/min
Rest Nuclear Isotope Dose: 10.2 mCi
ST Depression (mm): 0 mm
Stress Nuclear Isotope Dose: 31.3 mCi
TID: 0.97

## 2022-02-10 LAB — LIPID PANEL
Cholesterol: 189 mg/dL (ref 0–200)
HDL: 45 mg/dL (ref >40)
LDL Cholesterol: 131 mg/dL — ABNORMAL HIGH (ref 0–99)
Total CHOL/HDL Ratio: 4.2 RATIO
Triglycerides: 64 mg/dL (ref <150)
VLDL: 13 mg/dL (ref 0–40)

## 2022-02-10 LAB — CBC
HCT: 40 % (ref 39.0–52.0)
Hemoglobin: 13.9 g/dL (ref 13.0–17.0)
MCH: 32.1 pg (ref 26.0–34.0)
MCHC: 34.8 g/dL (ref 30.0–36.0)
MCV: 92.4 fL (ref 80.0–100.0)
Platelets: 95 10*3/uL — ABNORMAL LOW (ref 150–400)
RBC: 4.33 MIL/uL (ref 4.22–5.81)
RDW: 13 % (ref 11.5–15.5)
WBC: 7 10*3/uL (ref 4.0–10.5)
nRBC: 0 % (ref 0.0–0.2)

## 2022-02-10 LAB — BASIC METABOLIC PANEL
Anion gap: 9 (ref 5–15)
BUN: 15 mg/dL (ref 8–23)
CO2: 25 mmol/L (ref 22–32)
Calcium: 9.1 mg/dL (ref 8.9–10.3)
Chloride: 104 mmol/L (ref 98–111)
Creatinine, Ser: 1.24 mg/dL (ref 0.61–1.24)
GFR, Estimated: 55 mL/min — ABNORMAL LOW (ref >60)
Glucose, Bld: 121 mg/dL — ABNORMAL HIGH (ref 70–99)
Potassium: 4 mmol/L (ref 3.5–5.1)
Sodium: 138 mmol/L (ref 135–145)

## 2022-02-10 LAB — GLUCOSE, CAPILLARY
Glucose-Capillary: 109 mg/dL — ABNORMAL HIGH (ref 70–99)
Glucose-Capillary: 110 mg/dL — ABNORMAL HIGH (ref 70–99)
Glucose-Capillary: 172 mg/dL — ABNORMAL HIGH (ref 70–99)

## 2022-02-10 LAB — ECHOCARDIOGRAM COMPLETE
AR max vel: 0.65 cm2
AV Area VTI: 0.61 cm2
AV Area mean vel: 0.62 cm2
AV Mean grad: 30 mmHg
AV Peak grad: 50 mmHg
Ao pk vel: 3.54 m/s
Area-P 1/2: 5.13 cm2
Calc EF: 43.5 %
Height: 65 in
P 1/2 time: 256 msec
S' Lateral: 2.9 cm
Single Plane A2C EF: 37.5 %
Single Plane A4C EF: 49.2 %
Weight: 2436.8 oz

## 2022-02-10 LAB — HEMOGLOBIN A1C
Hgb A1c MFr Bld: 6.3 % — ABNORMAL HIGH (ref 4.8–5.6)
Mean Plasma Glucose: 134.11 mg/dL

## 2022-02-10 LAB — TSH: TSH: 2.009 u[IU]/mL (ref 0.350–4.500)

## 2022-02-10 MED ORDER — TECHNETIUM TC 99M TETROFOSMIN IV KIT
31.3000 | PACK | Freq: Once | INTRAVENOUS | Status: AC | PRN
  Administered 2022-02-10: 31.3 via INTRAVENOUS

## 2022-02-10 MED ORDER — PANTOPRAZOLE SODIUM 40 MG PO TBEC: 40.0000 mg | DELAYED_RELEASE_TABLET | Freq: Every day | ORAL | 0 refills | Status: AC

## 2022-02-10 MED ORDER — AMLODIPINE BESYLATE 2.5 MG PO TABS
2.5000 mg | ORAL_TABLET | Freq: Every day | ORAL | Status: DC
  Administered 2022-02-10 (×2): 2.5 mg via ORAL
  Filled 2022-02-10 (×2): qty 1

## 2022-02-10 MED ORDER — AMLODIPINE BESYLATE 2.5 MG PO TABS: 2.5000 mg | ORAL_TABLET | Freq: Two times a day (BID) | ORAL | 0 refills | Status: AC

## 2022-02-10 MED ORDER — CARVEDILOL 12.5 MG PO TABS: 12.5000 mg | ORAL_TABLET | Freq: Two times a day (BID) | ORAL | 0 refills | Status: AC

## 2022-02-10 MED ORDER — REGADENOSON 0.4 MG/5ML IV SOLN
0.4000 mg | Freq: Once | INTRAVENOUS | Status: AC
  Filled 2022-02-10: qty 5

## 2022-02-10 MED ORDER — PERFLUTREN LIPID MICROSPHERE
1.0000 mL | INTRAVENOUS | Status: AC | PRN
  Administered 2022-02-10: 3 mL via INTRAVENOUS

## 2022-02-10 MED ORDER — ROSUVASTATIN CALCIUM 20 MG PO TABS
40.0000 mg | ORAL_TABLET | Freq: Every day | ORAL | Status: DC
  Administered 2022-02-10: 40 mg via ORAL
  Filled 2022-02-10: qty 2

## 2022-02-10 MED ORDER — REGADENOSON 0.4 MG/5ML IV SOLN
INTRAVENOUS | Status: AC
  Administered 2022-02-10: 0.4 mg via INTRAVENOUS
  Filled 2022-02-10: qty 5

## 2022-02-10 MED ORDER — TECHNETIUM TC 99M TETROFOSMIN IV KIT
10.2000 | PACK | Freq: Once | INTRAVENOUS | Status: AC | PRN
  Administered 2022-02-10: 10.2 via INTRAVENOUS

## 2022-02-10 NOTE — Discharge Summary (Signed)
Physician Discharge Summary   Patient: Norman Hart MRN: [id number] DOB: [birth year]  Admit date:     02/08/2022  Discharge date: 02/10/22  Discharge Physician: [name]   PCP: Osie Cheeks, PA-C   Recommendations at discharge:  Follow up with cardiology in the next week for repeat BP check and antihypertensive management.  Metoprolol changed to coreg, norvasc added. Follow up chest discomfort. Had low risk stress test, started trial of PPI.  Discharge Diagnoses: Principal Problem:   Chest pain with history of 4 vessel CABG  Active Problems:   H/O aortic valve replacement with porcine valve   CKD (chronic kidney disease) stage 3, GFR 30-59 ml/min (HCC)   Bilateral carotid artery stenosis   Essential hypertension   Type 2 diabetes mellitus (HCC)   Hyperlipidemia   History of CVA (cerebrovascular accident)   OSA (obstructive sleep apnea)   CAD (coronary artery disease)  Hospital Course: Norman Hart is a 88 y.o. male with a history of CAD s/p 4v CABG 2004, chronic ICM, T2DM, HLD, HTN, OSA, hx CVA, s/p AVR, carotid stenosis s/p left CEA who presented to the ED 10/29 with chest pain. Cardiac enzymes were negative, ECG without acute ischemia. He was admitted for risk stratification with cardiology consultation. Nuclear stress testing was interpreted as a normal study with normal LV perfusion as detailed below. Echocardiogram revealed LVEF 45-50% and severe aortic insufficiency, moderate AS, AV mean gradient 62mHg (full reports of both below). Due to high blood pressure, aiming for afterload reduction, metoprolol was changed to coreg and norvasc was added. Due to concern that symptoms may be attributable to GERD, PPI was prescribed and the patient discharged in stable condition.   Assessment and Plan: CAD s/p 4v CABG 2004 (LIMA to LAD, constructed Y SVG to OM1, ICA, RCA) with current chest pain: Cardiac enzymes reassuring. Low risk stress.   - ASA, statin, BB - With chest pain and  constellation of accompanying symptoms, concern that GERD could be contributory, add PPI.  - Note history of contrast dye allergy   ICM, HFrEF: LVEF 45-50% with WMA on echo today, though no impaired perfusion on stress test.  - GDMT: Coreg. Note history of lisinopril angioedema. Euvolemic.   AS s/p porcine AVR 2004: Moderate AR (AV mean gradient 21) on prior echo report is worsened currently with mean gradient 370mg, mod AS.  - Defer to outpatient cardiology.  - Afterload reduction measures as mentioned  Stage IIIb CKD: CrCl at his baseline.     NIDT2DM: Well-controlled with HbA1c 6.3% on metformin.    HTN: Uncontrolled.  - Change metoprolol to coreg, add norvasc. Follow up with cardiology.    HLD: LDL is 131, pt has declined additional Tx. - Continue rosuvastatin.     OSA:  - Continue CPAP qHS    History of CVA:  - ASA, statin - Continue aricept which appears to have been a trial by outpatient neurology.   Carotid artery stenosis: s/p L CEA 1998.  - Outpatient follow up, continue ASA, statin  Consultants: Cardiology Procedures performed: Echo, stress test  Disposition: Home Diet recommendation:  Discharge Diet Orders (From admission, onward)     Start     Ordered   02/10/22 0000  Diet - low sodium heart healthy        02/10/22 1753           Cardiac and Carb modified diet DISCHARGE MEDICATION: Allergies as of 02/10/2022       Reactions   Ivp  Dye [iodinated Contrast Media]    Hives, lip swelling   Lisinopril Other (See Comments)   Angioedema (ALLERGY/intolerance)        Medication List     STOP taking these medications    metoprolol tartrate 50 MG tablet Commonly known as: LOPRESSOR       TAKE these medications    albuterol 108 (90 Base) MCG/ACT inhaler Commonly known as: VENTOLIN HFA Inhale 2 puffs into the lungs every 4 (four) hours as needed.   amLODipine 2.5 MG tablet Commonly known as: NORVASC Take 1 tablet (2.5 mg total) by mouth 2  (two) times daily.   Aspirin 81 MG Caps Take 81 mg by mouth daily.   carvedilol 12.5 MG tablet Commonly known as: COREG Take 1 tablet (12.5 mg total) by mouth 2 (two) times daily with a meal. Start taking on: February 11, 2022   donepezil 10 MG tablet Commonly known as: ARICEPT Take 10 mg by mouth at bedtime.   DropSafe Alcohol Prep 70 % Pads Apply topically.   ipratropium 0.06 % nasal spray Commonly known as: ATROVENT Place into the nose.   latanoprost 0.005 % ophthalmic solution Commonly known as: XALATAN Place 1 drop into both eyes nightly.   metFORMIN 500 MG tablet Commonly known as: GLUCOPHAGE Take 500 mg by mouth 2 (two) times daily with a meal.   pantoprazole 40 MG tablet Commonly known as: PROTONIX Take 1 tablet (40 mg total) by mouth daily. Start taking on: February 11, 2022   rosuvastatin 40 MG tablet Commonly known as: CRESTOR Take 1 tablet by mouth at bedtime.   True Metrix Air Glucose Meter w/Device Kit   True Metrix Blood Glucose Test test strip Generic drug: glucose blood SMARTSIG:Via Meter   True Metrix Level 1 Low Soln   TRUEplus Lancets 33G Misc   Tylenol 325 MG Caps Generic drug: Acetaminophen Take 1 tablet by mouth every 6 (six) hours as needed (pain).        Follow-up Information     Osie Cheeks, PA-C Follow up.   Specialty: Family Medicine Contact information: [address] [place] [address] [phone]         Varney Biles, [place]. Schedule an appointment as soon as possible for a visit in 1 week(s).   Specialty: Cardiology Contact information: [address] [place] [address] [phone]                Discharge Exam: Danley Danker Weights   02/08/22 2232 02/09/22 1520  Weight: 70.5 kg 69.1 kg  Gen: Elderly, pleasant, well-appearing male in no distress Pulm: Clear and nonlabored  CV: RRR, no JVD, no edema. Prominent systolic murmur at the base. GI: Soft, NT, ND, +BS   Neuro: Alert and oriented. No focal deficits. Ext: Warm, no deformities Skin: No rashes, lesions ulcers  Condition at discharge: stable  The results of significant diagnostics from this hospitalization (including imaging, microbiology, ancillary and laboratory) are listed below for reference.   Imaging Studies: NM Myocar Multi W/Spect W/Wall Motion / EF  Result Date: 02/10/2022   Normal study. Apical thinning artifact. EF visually appears greater than 43%. No ischemia or infarction.   LV perfusion is normal. There is no evidence of ischemia. There is no evidence of infarction.   Left ventricular function is normal. Nuclear stress EF: 43 %. The left ventricular ejection fraction is mildly decreased (45-54%). End diastolic cavity size is normal. End systolic cavity size is normal.   The  study is normal. The study is low risk.   ECHOCARDIOGRAM COMPLETE  Result Date: 02/10/2022    ECHOCARDIOGRAM REPORT   Patient Name:   ANTARIO YASUDA Date of Exam: 02/10/2022 Medical Rec #:  [id number]  Height:       65.0 in Accession #:    [id number] Weight:       152.3 lb Date of Birth:  April 03, 1929  BSA:          1.762 m Patient Age:    88 years   BP:           177/80 mmHg Patient Gender: M          HR:           83 bpm. Exam Location:  Inpatient Procedure: 2D Echo, Cardiac Doppler, Color Doppler and Intracardiac            Opacification Agent Indications:    R07.9* Chest pain, unspecified  History:        Patient has no prior history of Echocardiogram examinations.                 CAD; Risk Factors:Hypertension and Diabetes.  Sonographer:    Bernadene Person RDCS Referring Phys: [id number] [place]  1. Left ventricular ejection fraction, by estimation, is 45 to 50%. The left ventricle has mildly decreased function. The left ventricle demonstrates regional wall motion abnormalities (see scoring diagram/findings for description). Left ventricular diastolic parameters are indeterminate.  2. Right ventricular  systolic function is normal. The right ventricular size is normal. Tricuspid regurgitation signal is inadequate for assessing PA pressure.  3. The mitral valve is degenerative. Mild mitral valve regurgitation. No evidence of mitral stenosis. Moderate mitral annular calcification.  4. The aortic valve is abnormal. There is severe calcifcation of the aortic valve. Aortic valve regurgitation is severe. Moderate aortic valve stenosis. Aortic regurgitation PHT measures 256 msec. Aortic valve mean gradient measures 30.0 mmHg. Aortic valve Vmax measures 3.54 m/s. DVI and valve area likely underestimated due to suboptimal LVOT TVI alignment. Stroke volume index may be reduced, which may represent low flow, low gradient AS.  5. The inferior vena cava is normal in size with greater than 50% respiratory variability, suggesting right atrial pressure of 3 mmHg. 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 Left ventricular ejection fraction, by estimation, is 45 to 50%. The left ventricle has mildly decreased function. The left ventricle demonstrates regional wall motion abnormalities. Definity contrast agent was given IV to delineate the left ventricular endocardial borders. The left ventricular internal cavity size was normal in size. There is no left ventricular hypertrophy. Left ventricular diastolic parameters are indeterminate.  LV Wall Scoring: The inferior wall and posterior wall are hypokinetic. Right Ventricle: The right ventricular size is normal. No increase in right ventricular wall thickness. Right ventricular systolic function is normal. Tricuspid regurgitation signal is inadequate for assessing PA pressure. Left Atrium: Left atrial size was normal in size. Right Atrium: Right atrial size was normal in size. Pericardium: There is no evidence of pericardial effusion. Mitral Valve: The mitral valve is degenerative in appearance. Moderate mitral annular calcification. Mild mitral valve  regurgitation. No evidence of mitral valve stenosis. Tricuspid Valve: The tricuspid valve is normal in structure. Tricuspid valve regurgitation is trivial. No evidence of tricuspid stenosis. Aortic Valve: The aortic valve is abnormal. There is severe calcifcation of the aortic valve. Aortic valve regurgitation is severe. Aortic regurgitation PHT measures 256 msec. Moderate aortic stenosis is  present. Aortic valve mean gradient measures 30.0 mmHg. Aortic valve peak gradient measures 50.0 mmHg. Aortic valve area, by VTI measures 0.61 cm. Pulmonic Valve: The pulmonic valve was not well visualized. Pulmonic valve regurgitation is trivial. No evidence of pulmonic stenosis. Aorta: The aortic root and ascending aorta are structurally normal, with no evidence of dilitation. Ascending aorta measurements are within normal limits for age when indexed to body surface area. Venous: The inferior vena cava is normal in size with greater than 50% respiratory variability, suggesting right atrial pressure of 3 mmHg. IAS/Shunts: The interatrial septum was not well visualized.  LEFT VENTRICLE PLAX 2D LVIDd:         4.20 cm      Diastology LVIDs:         2.90 cm      LV e' medial:    2.25 cm/s LV PW:         0.90 cm      LV E/e' medial:  17.2 LV IVS:        0.70 cm      LV e' lateral:   3.62 cm/s LVOT diam:     2.10 cm      LV E/e' lateral: 10.7 LV SV:         47 LV SV Index:   27 LVOT Area:     3.46 cm  LV Volumes (MOD) LV vol d, MOD A2C: 108.0 ml LV vol d, MOD A4C: 127.0 ml LV vol s, MOD A2C: 67.5 ml LV vol s, MOD A4C: 64.5 ml LV SV MOD A2C:     40.5 ml LV SV MOD A4C:     127.0 ml LV SV MOD BP:      51.4 ml RIGHT VENTRICLE RV S prime:     6.47 cm/s TAPSE (M-mode): 1.0 cm LEFT ATRIUM             Index        RIGHT ATRIUM           Index LA diam:        3.30 cm 1.87 cm/m   RA Area:     15.00 cm LA Vol (A2C):   54.7 ml 31.05 ml/m  RA Volume:   36.30 ml  20.60 ml/m LA Vol (A4C):   43.5 ml 24.69 ml/m LA Biplane Vol: 50.7 ml 28.78  ml/m  AORTIC VALVE AV Area (Vmax):    0.65 cm AV Area (Vmean):   0.62 cm AV Area (VTI):     0.61 cm AV Vmax:           353.50 cm/s AV Vmean:          253.000 cm/s AV VTI:            0.776 m AV Peak Grad:      50.0 mmHg AV Mean Grad:      30.0 mmHg LVOT Vmax:         66.00 cm/s LVOT Vmean:        45.000 cm/s LVOT VTI:          0.137 m LVOT/AV VTI ratio: 0.18 AI PHT:            256 msec  AORTA Ao Root diam: 3.70 cm Ao Asc diam:  3.30 cm MITRAL VALVE MV Area (PHT): 5.13 cm  SHUNTS MV Decel Time: 148 msec  Systemic VTI:  0.14 m  Systemic Diam: 2.10 cm Cherlynn Kaiser MD Electronically signed by Cherlynn Kaiser MD Signature Date/Time: 02/10/2022/11:51:12 AM    Final    DG Chest Portable 1 View  Result Date: 02/08/2022 CLINICAL DATA:  Chest pain, diaphoresis. EXAM: PORTABLE CHEST 1 VIEW COMPARISON:  06/08/2021. FINDINGS: The heart size and mediastinal contours are within normal limits. There is atherosclerotic calcification of the aorta. Chronic elevation of the left diaphragm is noted with atelectasis at the left lung base. There is mild blunting of the right costophrenic angle, possible small pleural effusion. No pneumothorax. Sternotomy wires are present over the midline. No acute osseous abnormality. IMPRESSION: 1. Elevation of the left diaphragm with atelectasis at the left lung base. 2. Blunting of the right costophrenic angle suggesting small pleural effusion. Electronically Signed   By: Brett Fairy M.D.   On: 02/08/2022 23:06    Microbiology: Results for orders placed or performed during the hospital encounter of 06/08/21  Resp Panel by RT-PCR (Flu A&B, Covid) Nasopharyngeal Swab     Status: None   Collection Time: 06/08/21 10:52 PM   Specimen: Nasopharyngeal Swab; Nasopharyngeal(NP) swabs in vial transport medium  Result Value Ref Range Status   SARS Coronavirus 2 by RT PCR NEGATIVE NEGATIVE Final    Comment: (NOTE) SARS-CoV-2 target nucleic acids are NOT  DETECTED.  The SARS-CoV-2 RNA is generally detectable in upper respiratory specimens during the acute phase of infection. The lowest concentration of SARS-CoV-2 viral copies this assay can detect is 138 copies/mL. A negative result does not preclude SARS-Cov-2 infection and should not be used as the sole basis for treatment or other patient management decisions. A negative result may occur with  improper specimen collection/handling, submission of specimen other than nasopharyngeal swab, presence of viral mutation(s) within the areas targeted by this assay, and inadequate number of viral copies(<138 copies/mL). A negative result must be combined with clinical observations, patient history, and epidemiological information. The expected result is Negative.  Fact Sheet for Patients:  EntrepreneurPulse.com.au  Fact Sheet for Healthcare Providers:  IncredibleEmployment.be  This test is no t yet approved or cleared by the [place] FDA and  has been authorized for detection and/or diagnosis of SARS-CoV-2 by FDA under an Emergency Use Authorization (EUA). This EUA will remain  in effect (meaning this test can be used) for the duration of the COVID-19 declaration under Section 564(b)(1) of the Act, 21 U.S.C.section 360bbb-3(b)(1), unless the authorization is terminated  or revoked sooner.       Influenza A by PCR NEGATIVE NEGATIVE Final   Influenza B by PCR NEGATIVE NEGATIVE Final    Comment: (NOTE) The Xpert Xpress SARS-CoV-2/FLU/RSV plus assay is intended as an aid in the diagnosis of influenza from Nasopharyngeal swab specimens and should not be used as a sole basis for treatment. Nasal washings and aspirates are unacceptable for Xpert Xpress SARS-CoV-2/FLU/RSV testing.  Fact Sheet for Patients: EntrepreneurPulse.com.au  Fact Sheet for Healthcare Providers: IncredibleEmployment.be  This test is not yet  approved or cleared by the [place] FDA and has been authorized for detection and/or diagnosis of SARS-CoV-2 by FDA under an Emergency Use Authorization (EUA). This EUA will remain in effect (meaning this test can be used) for the duration of the COVID-19 declaration under Section 564(b)(1) of the Act, 21 U.S.C. section 360bbb-3(b)(1), unless the authorization is terminated or revoked.  Performed at [facility], [address] [place],  [address]     Labs: CBC: Recent Labs  Lab 02/08/22 2237 02/10/22  0156  WBC 7.3 7.0  NEUTROABS 5.2  --   HGB 14.2 13.9  HCT 42.8 40.0  MCV 95.1 92.4  PLT 105* 95*   Basic Metabolic Panel: Recent Labs  Lab 02/08/22 2237 02/10/22 0156  NA 138 138  K 4.3 4.0  CL 106 104  CO2 27 25  GLUCOSE 131* 121*  BUN 13 15  CREATININE 1.36* 1.24  CALCIUM 8.9 9.1   Liver Function Tests: Recent Labs  Lab 02/08/22 2237  AST 26  ALT 14  ALKPHOS 52  BILITOT 0.6  PROT 7.3  ALBUMIN 3.9   CBG: Recent Labs  Lab 02/09/22 1731 02/09/22 2106 02/10/22 0759 02/10/22 1124 02/10/22 1648  GLUCAP 114* 147* 109* 110* 172*    Discharge time spent: greater than 30 minutes.  Signed: [name], MD [facility] 02/10/2022

## 2022-02-10 NOTE — Care Management (Signed)
  Transition of Care [facility]) Screening Note   Patient Details  Name: Norman Hart Date of Birth: 1929-01-24   Transition of Care [facility]) CM/SW Contact:    Bethena Roys, RN Phone Number: 02/10/2022, 4:24 PM    Transition of Care Department [facility]) has reviewed the patient and no TOC needs have been identified at this time. We will continue to monitor patient advancement through interdisciplinary progression rounds. If new patient transition needs arise, please place a TOC consult.

## 2022-02-10 NOTE — Progress Notes (Addendum)
Rounding Note    Patient Name: Norman Hart Date of Encounter: 02/10/2022  [place] Cardiologist: [place]  Subjective   Pt found resting in bed, no chest pain. He mentions hot and cold flashes.  Inpatient Medications    Scheduled Meds:  aspirin  81 mg Oral Daily   carvedilol  12.5 mg Oral BID WC   donepezil  10 mg Oral QHS   enoxaparin (LOVENOX) injection  40 mg Subcutaneous Q24H   insulin aspart  0-9 Units Subcutaneous TID WC   latanoprost  1 drop Both Eyes QHS   pantoprazole  40 mg Oral Daily   rosuvastatin  40 mg Oral Daily   sodium chloride flush  3 mL Intravenous Q12H   Continuous Infusions:  sodium chloride     PRN Meds: sodium chloride, acetaminophen **OR** acetaminophen, albuterol, nitroGLYCERIN, sodium chloride flush   Vital Signs    Vitals:   02/09/22 1520 02/09/22 2109 02/10/22 0039 02/10/22 0640  BP: (!) 147/74 (!) 155/56 (!) 178/73 (!) 170/78  Pulse: 81 65 85 81  Resp: 20 20 18 20   Temp: 98.5 F (36.9 C) 98.2 F (36.8 C) 98.4 F (36.9 C) 98.4 F (36.9 C)  TempSrc: Oral Oral Oral Oral  SpO2: 98% 99% 100% 100%  Weight: 69.1 kg     Height:       No intake or output data in the 24 hours ending 02/10/22 0741    02/09/2022    3:20 PM 02/08/2022   10:32 PM 06/08/2021   10:41 PM  Last 3 Weights  Weight (lbs) 152 lb 4.8 oz 155 lb 6.4 oz 154 lb  Weight (kg) 69.083 kg 70.489 kg 69.854 kg      Telemetry    Sinus rhythm HR 80s - Personally Reviewed  ECG    No new tracings - Personally Reviewed  Physical Exam   GEN: No acute distress.   Neck: No JVD Cardiac: RRR, 4/6 systolic murmur Respiratory: Clear to auscultation bilaterally. GI: Soft, nontender, non-distended  MS: No edema; No deformity. Neuro:  Nonfocal  Psych: Normal affect   Labs    High Sensitivity Troponin:   Recent Labs  Lab 02/08/22 2237 02/09/22 0034  TROPONINIHS 16 16     Chemistry Recent Labs  Lab 02/08/22 2237 02/10/22 0156  NA 138 138  K 4.3  4.0  CL 106 104  CO2 27 25  GLUCOSE 131* 121*  BUN 13 15  CREATININE 1.36* 1.24  CALCIUM 8.9 9.1  PROT 7.3  --   ALBUMIN 3.9  --   AST 26  --   ALT 14  --   ALKPHOS 52  --   BILITOT 0.6  --   GFRNONAA 49* 55*  ANIONGAP 5 9    Lipids  Recent Labs  Lab 02/10/22 0156  CHOL 189  TRIG 64  HDL 45  LDLCALC 131*  CHOLHDL 4.2    Hematology Recent Labs  Lab 02/08/22 2237 02/10/22 0156  WBC 7.3 7.0  RBC 4.50 4.33  HGB 14.2 13.9  HCT 42.8 40.0  MCV 95.1 92.4  MCH 31.6 32.1  MCHC 33.2 34.8  RDW 13.2 13.0  PLT 105* 95*   Thyroid No results for input(s): "TSH", "FREET4" in the last 168 hours.  BNPNo results for input(s): "BNP", "PROBNP" in the last 168 hours.  DDimer No results for input(s): "DDIMER" in the last 168 hours.   Radiology    DG Chest Portable 1 View  Result Date: 02/08/2022 CLINICAL  DATA:  Chest pain, diaphoresis. EXAM: PORTABLE CHEST 1 VIEW COMPARISON:  06/08/2021. FINDINGS: The heart size and mediastinal contours are within normal limits. There is atherosclerotic calcification of the aorta. Chronic elevation of the left diaphragm is noted with atelectasis at the left lung base. There is mild blunting of the right costophrenic angle, possible small pleural effusion. No pneumothorax. Sternotomy wires are present over the midline. No acute osseous abnormality. IMPRESSION: 1. Elevation of the left diaphragm with atelectasis at the left lung base. 2. Blunting of the right costophrenic angle suggesting small pleural effusion. Electronically Signed   By: Brett Fairy M.D.   On: 02/08/2022 23:06    Cardiac Studies   Nuclear stress test today  Echo today   Patient Profile     88 y.o. male with a hx of CAD (s/p CABG in 2004 with LIMA-LAD, Y graft to OM1 and Ramus and SVG-RCA), aortic stenosis (s/p AVR in 2004 with 23 mm Edwards bioprosthetic valve), carotid artery stenosis (s/p L CEA in 1998), HTN, HLD, Type 2 DM, Stage 3 CKD and prior CVA who is being seen  02/09/2022 for the evaluation of chest pain at the request of Dr. Rogers Blocker.  Assessment & Plan    Chest pain HS troponin x 2 negative EKG with no acute ischemic changes Chest pain felt to be with typical and atypical features High suspicion for GERD, PPI started Given his history of CAD, nuclear stress test was planned for today - NPO - he is very active at home, mows yard, cuts brushes - I suspect he would be a cath candidate should the stress test be abnormal   CAD s/p CABG 2004 Continue ASA, crestor, BB   Hyperlipidemia with LDL goal < 70 Could argue for a lower LDL to 55 with prediabetes 02/10/2022: Cholesterol 189; HDL 45; LDL Cholesterol 131; Triglycerides 64; VLDL 13 He has historically not been interested in additional medications with 40 mg crestor.   Hypertensive urgency Hypertension - he remains hypertensive in the 170-180s  He says BP at home 150s and higher - asymptomatic - only on lopressor 50 mg BID - he does have a history of drug induced hypotension requiring hospitalization on lisinopril and 2.5 mg amlodipine - I will start 2.5 amlodipine, will avoid ACEI with current renal function   AS s/p AVR 2004 at the time of CABG Echo 04/2021: moderate AI  Repeat echo pending   Prediabetes A1c 6.3%   CKD stage 3 sCr 1.36    Thrombocytopenia PLT 95, have drifted down over the last year   Hot and cold flashes, per patient - his primary complaint today Will check a TSH     For questions or updates, please contact [place] Please consult [email or web address] for contact info under        Signed, Ledora Bottcher, PA  02/10/2022, 7:41 AM    Pt seen and examined   I agree with findings as noted by A [facility] above  27 yo s/p CABG/AVR in 2004    Speels of sweatiness, dizziness, some chest discomfort that occur at rest only   Not with activity   He is active in yard, no changes  No events while walking / working   On exam Lungs are CTA Cardiac RRR    II/VI systolic murmur LSB Abd is supple  Ext without edema  Plan for myoview today    Also echo       BP has been uncontrolled   Is high  at home he says    Try very low dose amlodiopne   WIll follow   Dorris Carnes MD

## 2022-02-10 NOTE — Care Management Obs Status (Signed)
[facility] NOTIFICATION   Patient Details  Name: Norman Hart MRN: [id number] Date of Birth: [birth year]   Medicare Observation Status Notification Given:  Yes    Bethena Roys, RN 02/10/2022, 4:23 PM

## 2022-02-10 NOTE — Progress Notes (Signed)
Myoview scan shows normal perfusion Echo shows mild LV dysfunction and severe Aortic insuffiency  I am not sure what caused his spells   ? May be AI  I think he is OK to go home    I will try to get a copy of echo put on disc and send to his cardiologist  Pt should follow up with his cardiologist  Dorris Carnes MD

## 2022-02-10 NOTE — Progress Notes (Signed)
   Norman Hart presented for a nuclear stress test today.  No immediate complications.  Stress imaging is pending at this time.  Preliminary EKG findings may be listed in the chart, but the stress test result will not be finalized until perfusion imaging is complete.  Tami Lin Kyann Heydt, PA-C 02/10/2022, 2:47 PM

## 2022-02-10 NOTE — Plan of Care (Signed)
  Problem: Education: Goal: Knowledge of General Education information will improve Description: Including pain rating scale, medication(s)/side effects and non-pharmacologic comfort measures Outcome: Adequate for Discharge   Problem: Health Behavior/Discharge Planning: Goal: Ability to manage health-related needs will improve Outcome: Adequate for Discharge   Problem: Clinical Measurements: Goal: Ability to maintain clinical measurements within normal limits will improve Outcome: Adequate for Discharge Goal: Will remain free from infection Outcome: Adequate for Discharge Goal: Diagnostic test results will improve Outcome: Adequate for Discharge Goal: Respiratory complications will improve Outcome: Adequate for Discharge Goal: Cardiovascular complication will be avoided Outcome: Adequate for Discharge   Problem: Activity: Goal: Risk for activity intolerance will decrease Outcome: Adequate for Discharge   Problem: Nutrition: Goal: Adequate nutrition will be maintained Outcome: Adequate for Discharge   Problem: Coping: Goal: Level of anxiety will decrease Outcome: Adequate for Discharge   Problem: Elimination: Goal: Will not experience complications related to bowel motility Outcome: Adequate for Discharge Goal: Will not experience complications related to urinary retention Outcome: Adequate for Discharge   Problem: Pain Managment: Goal: General experience of comfort will improve Outcome: Adequate for Discharge   Problem: Safety: Goal: Ability to remain free from injury will improve Outcome: Adequate for Discharge   Problem: Skin Integrity: Goal: Risk for impaired skin integrity will decrease Outcome: Adequate for Discharge   Problem: Education: Goal: Ability to describe self-care measures that may prevent or decrease complications (Diabetes Survival Skills Education) will improve Outcome: Adequate for Discharge Goal: Individualized Educational Video(s) Outcome:  Adequate for Discharge   Problem: Coping: Goal: Ability to adjust to condition or change in health will improve Outcome: Adequate for Discharge   Problem: Fluid Volume: Goal: Ability to maintain a balanced intake and output will improve Outcome: Adequate for Discharge   Problem: Health Behavior/Discharge Planning: Goal: Ability to identify and utilize available resources and services will improve Outcome: Adequate for Discharge Goal: Ability to manage health-related needs will improve Outcome: Adequate for Discharge   Problem: Health Behavior/Discharge Planning: Goal: Ability to identify and utilize available resources and services will improve Outcome: Adequate for Discharge Goal: Ability to manage health-related needs will improve Outcome: Adequate for Discharge   Problem: Metabolic: Goal: Ability to maintain appropriate glucose levels will improve Outcome: Adequate for Discharge   Problem: Nutritional: Goal: Maintenance of adequate nutrition will improve Outcome: Adequate for Discharge Goal: Progress toward achieving an optimal weight will improve Outcome: Adequate for Discharge   Problem: Skin Integrity: Goal: Risk for impaired skin integrity will decrease Outcome: Adequate for Discharge   Problem: Tissue Perfusion: Goal: Adequacy of tissue perfusion will improve Outcome: Adequate for Discharge

## 2022-02-10 NOTE — Progress Notes (Signed)
Echocardiogram 2D Echocardiogram has been performed.  Norman Hart 02/10/2022, 10:40 AM

## 2022-02-10 NOTE — Progress Notes (Signed)
[facility] PROGRESS NOTE  Norman Hart  FIE:[id number] DOB: 12/27/1928 DOA: 02/08/2022 PCP: Osie Cheeks, PA-C Outpatient Specialists: Daivd Council, NP cardiology Brief Narrative: Norman Hart is a 88 y.o. male with a history of CAD s/p 4v CABG 2004, chronic ICM, T2DM, HLD, HTN, OSA, hx CVA, s/p AVR, carotid stenosis s/p left CEA who presented to the ED 10/29 with chest pain.    Subjective: No complaints this morning.  Objective: BP (!) 166/75   Pulse 66   Temp 98 F (36.7 C) (Oral)   Resp 19   Ht 5\' 5"  (1.651 m)   Wt 69.1 kg   SpO2 99%   BMI 25.34 kg/m   Gen: Elderly, pleasant, well-appearing male in no distress Pulm: Clear and nonlabored  CV: RRR, no JVD, no edema. Prominent systolic murmur at the base. GI: Soft, NT, ND, +BS  Neuro: Alert and oriented. No focal deficits. Ext: Warm, no deformities Skin: No rashes, lesions ulcers  Assessment & Plan: Principal Problem:   Chest pain with history of 4 vessel CABG  Active Problems:   H/O aortic valve replacement with porcine valve   CKD (chronic kidney disease) stage 3, GFR 30-59 ml/min (HCC)   Bilateral carotid artery stenosis   Essential hypertension   Type 2 diabetes mellitus (HCC)   Hyperlipidemia   History of CVA (cerebrovascular accident)   OSA (obstructive sleep apnea)   CAD (coronary artery disease)  CAD s/p 4v CABG 2004 (LIMA to LAD, constructed Y SVG to OM1, ICA, RCA) with current chest pain: Cardiac enzymes reassuring. - Echo, stress test per cardiology - ASA, statin, metoprolol - With chest pain and constellation of accompanying symptoms, concern that GERD could be contributory, add PPI.  - Note history of contrast dye allergy  ICM, HFrEF: LVEF 45-50% with WMA on echo today.  - Management per cardiology. Note history of lisinopril angioedema.  Stage IIIb CKD: CrCl at his baseline.    NIDT2DM: Well-controlled with HbA1c 6.3% on metformin.  - Hold metformin - SSI  HTN:  - On metoprolol as  above  HLD: LDL is 131, pt has declined additional Tx. - Continue rosuvastatin.    OSA:  - Continue CPAP qHS  AS s/p porcine AVR 2004: Moderate AR (AV mean gradient 21).  - Per cardiology  History of CVA:  - ASA, statin - Continue aricept which appears to have been a trial by outpatient neurology.  Carotid artery stenosis: s/p L CEA 1998.  - Outpatient follow up, continue ASA, statin  [name], MD [facility] [email or web address] 02/10/2022, 2:14 PM

## 2022-08-15 IMAGING — CT CT HEAD W/O CM
3 series · 14 of 47 positions shown, 16 images · non-contrast
Comparison: 08/15/2015

CLINICAL DATA: Headaches for several hours, initial encounter

EXAM:
CT HEAD WITHOUT CONTRAST
TECHNIQUE: Contiguous axial images were obtained from the base of the skull
through the vertex without intravenous contrast.

[Series 2: head wo · axial · 0.45mm/px · z∈[+34,+164]mm · 8 of 32 slices shown, 10 images]
[im 3/32  brain]
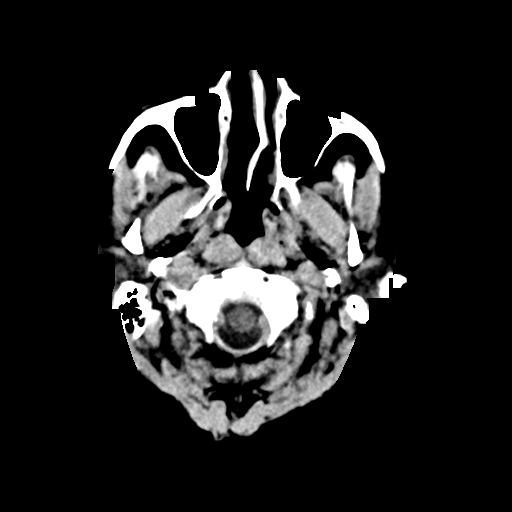
[im 3/32  bone]
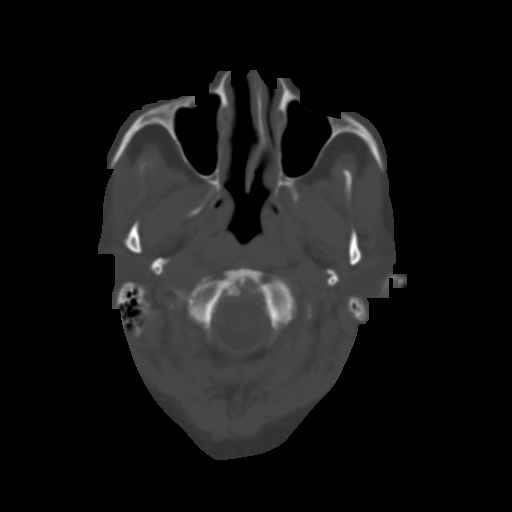
[im 7/32  brain]
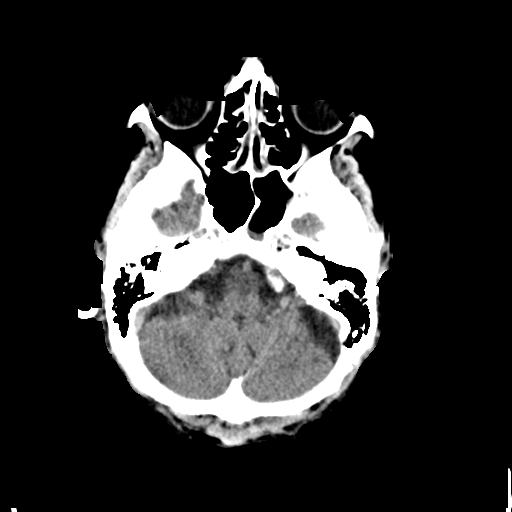
[im 10/32  brain]
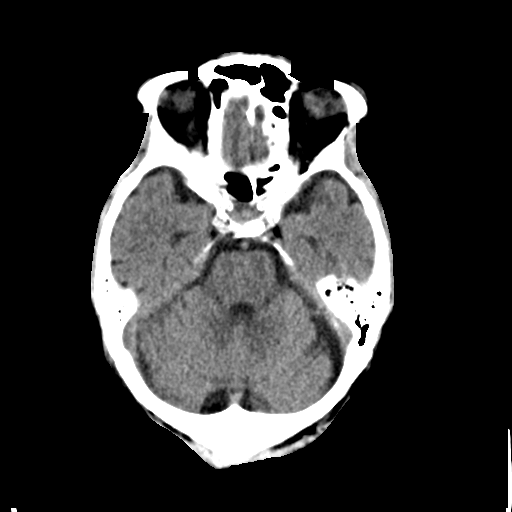
[im 14/32  brain]
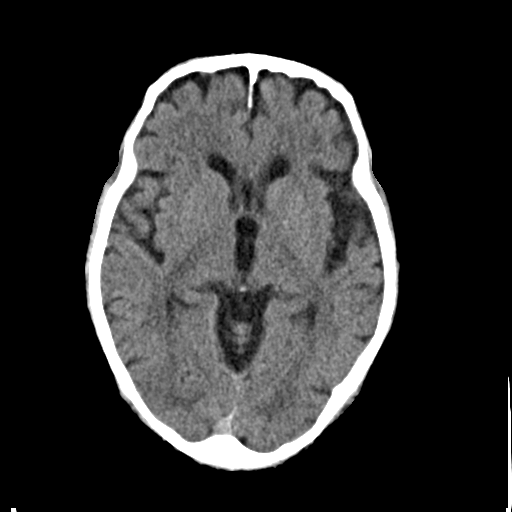
[im 18/32  brain]
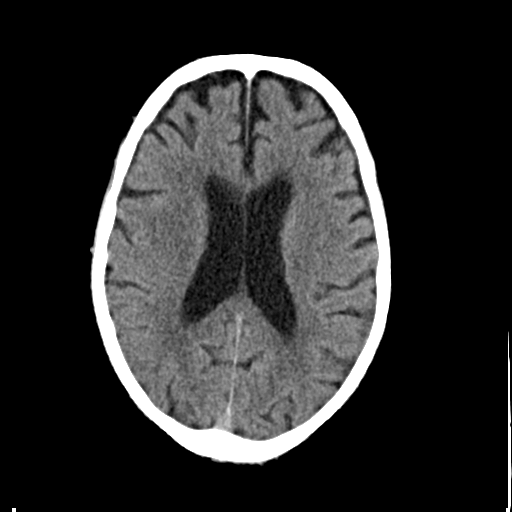
[im 18/32  bone]
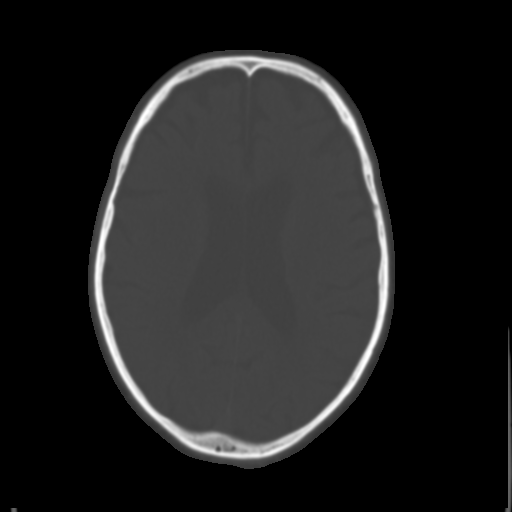
[im 22/32  brain]
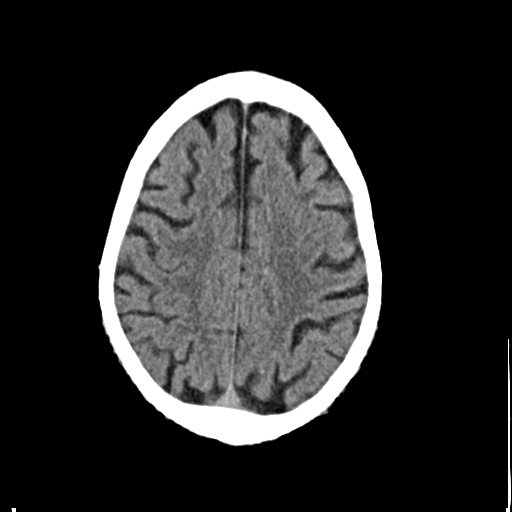
[im 25/32  brain]
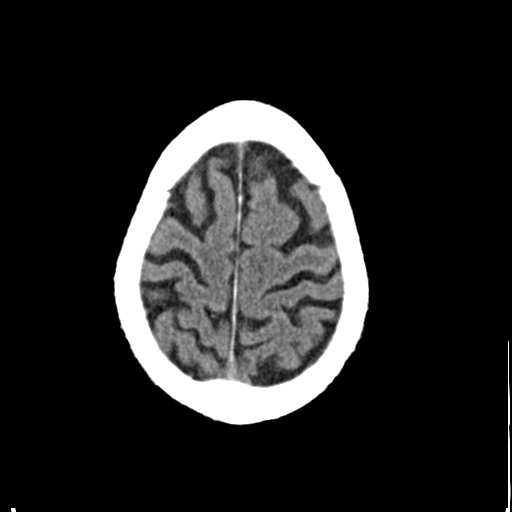
[im 29/32  brain]
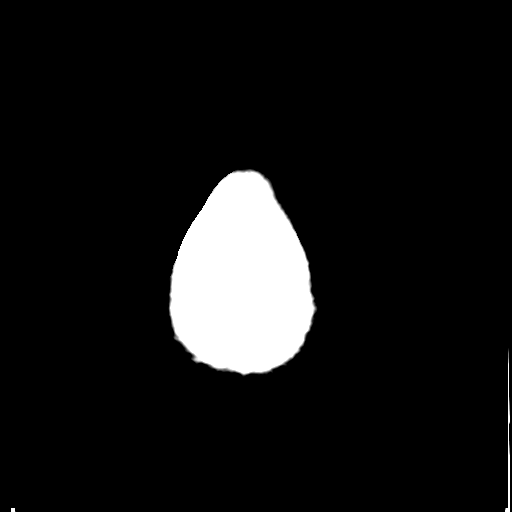

[Series 4: coronal soft · coronal · 0.30mm/px · 3 of 67 slices shown]
[im 23/67  brain]
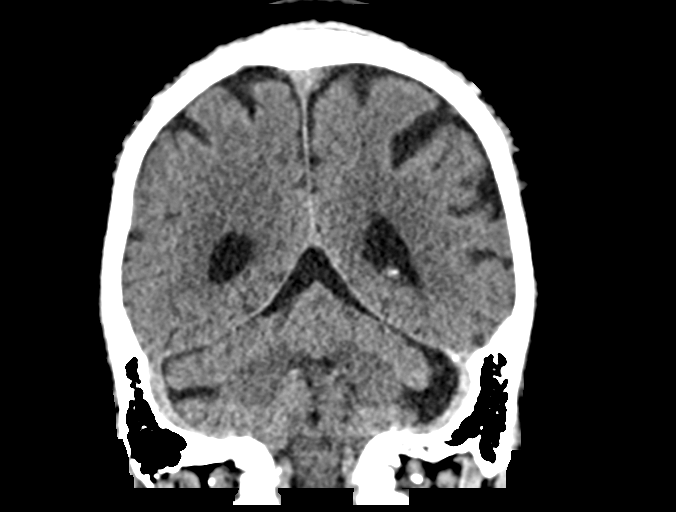
[im 30/67  brain]
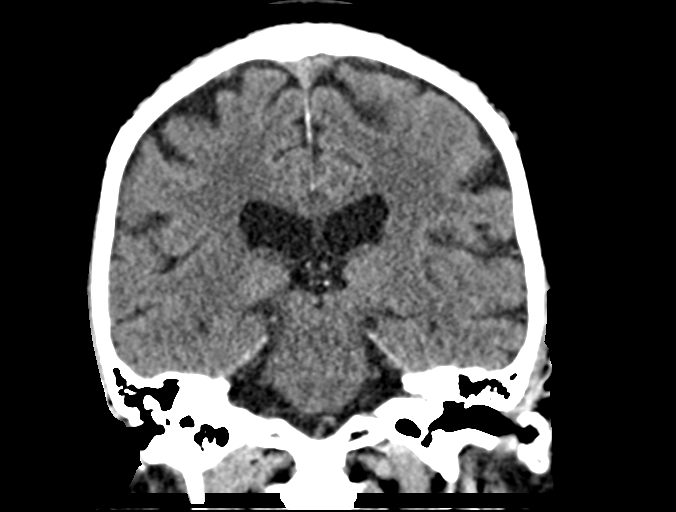
[im 37/67  brain]
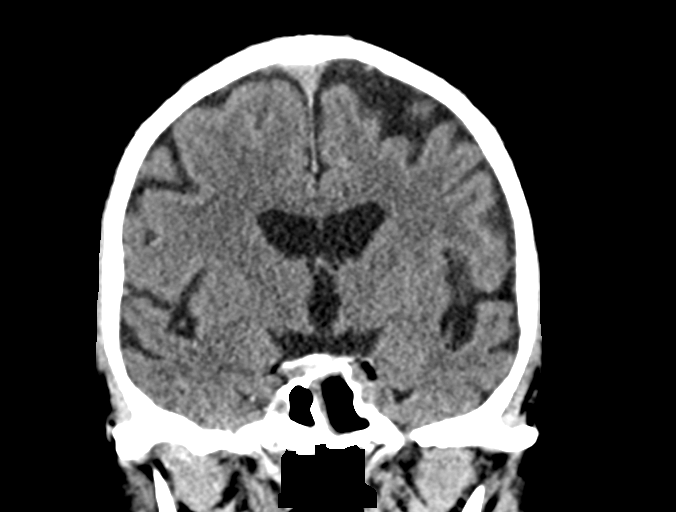

[Series 5: sag soft · sagittal · 0.30mm/px · 3 of 67 slices shown]
[im 23/67  brain]
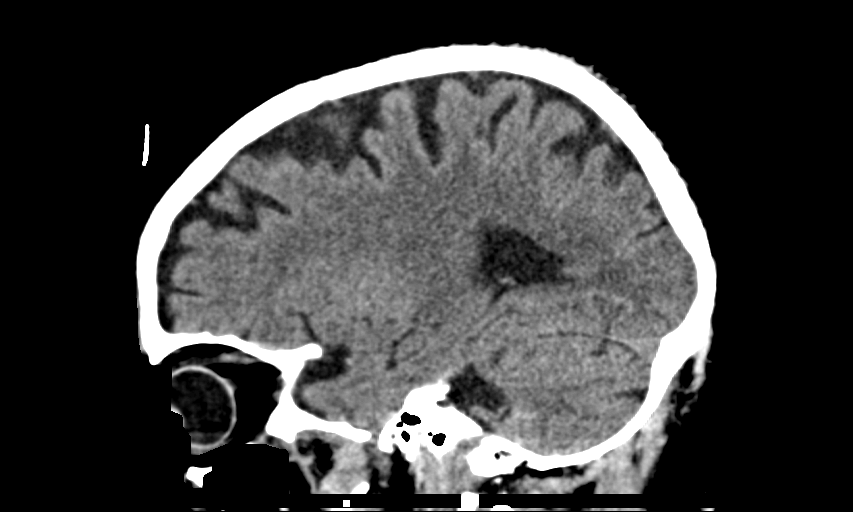
[im 34/67  brain]
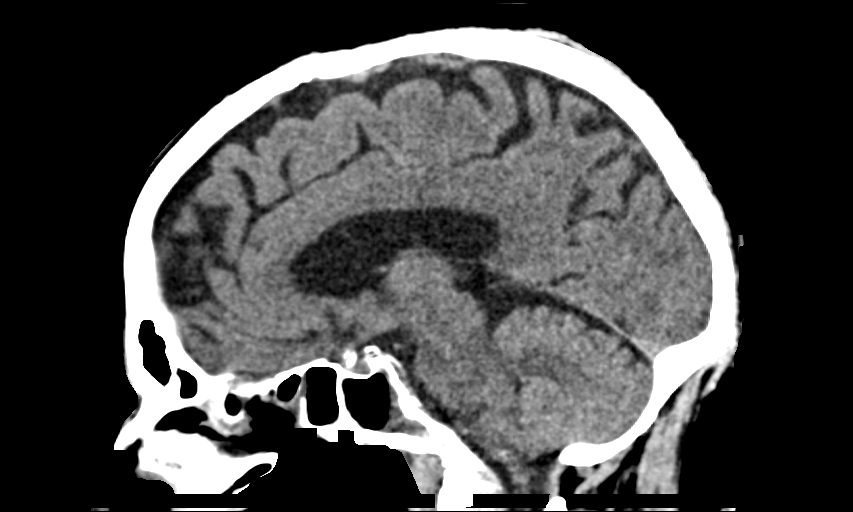
[im 45/67  brain]
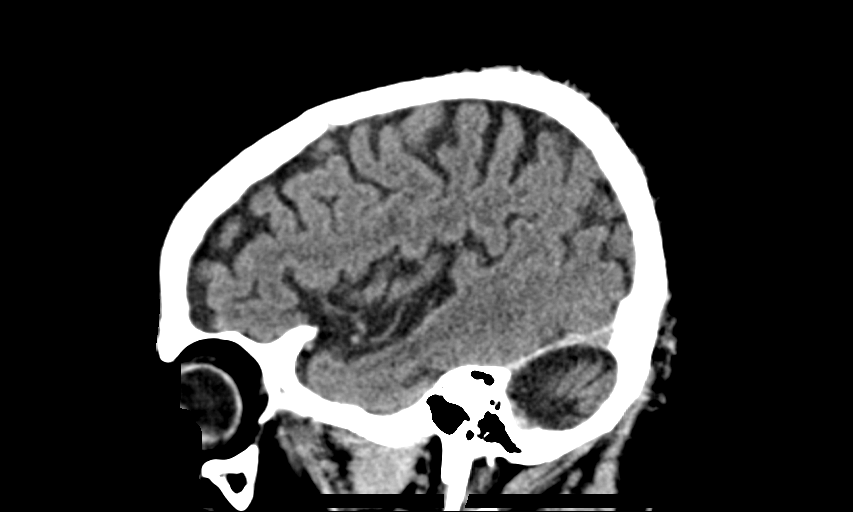

[14 of 47 positions shown; findings below may reference images not displayed]

FINDINGS: Brain: No evidence of acute infarction, hemorrhage, hydrocephalus,
extra-axial collection or mass lesion/mass effect. Chronic atrophic
and ischemic changes are noted commensurate with the patient's given
age.

Vascular: No hyperdense vessel or unexpected calcification.

Skull: Normal. Negative for fracture or focal lesion.

Sinuses/Orbits: Sinuses are well aerated. Some mucosal thickening in
the ethmoid and sphenoid sinuses is seen. No acute abnormality is
noted.

Other: None.
IMPRESSION: Chronic atrophic and ischemic changes stable from the prior exam.

Mucosal thickening in the paranasal sinuses without air-fluid level.

## 2022-11-25 IMAGING — DX DG CHEST 1V PORT
1 series · 1 of 1 positions shown · non-contrast
Comparison: Chest radiograph dated 02/26/2021.

CLINICAL DATA: Headache and shortness of breath.

EXAM:
PORTABLE CHEST 1 VIEW

[chest ap]
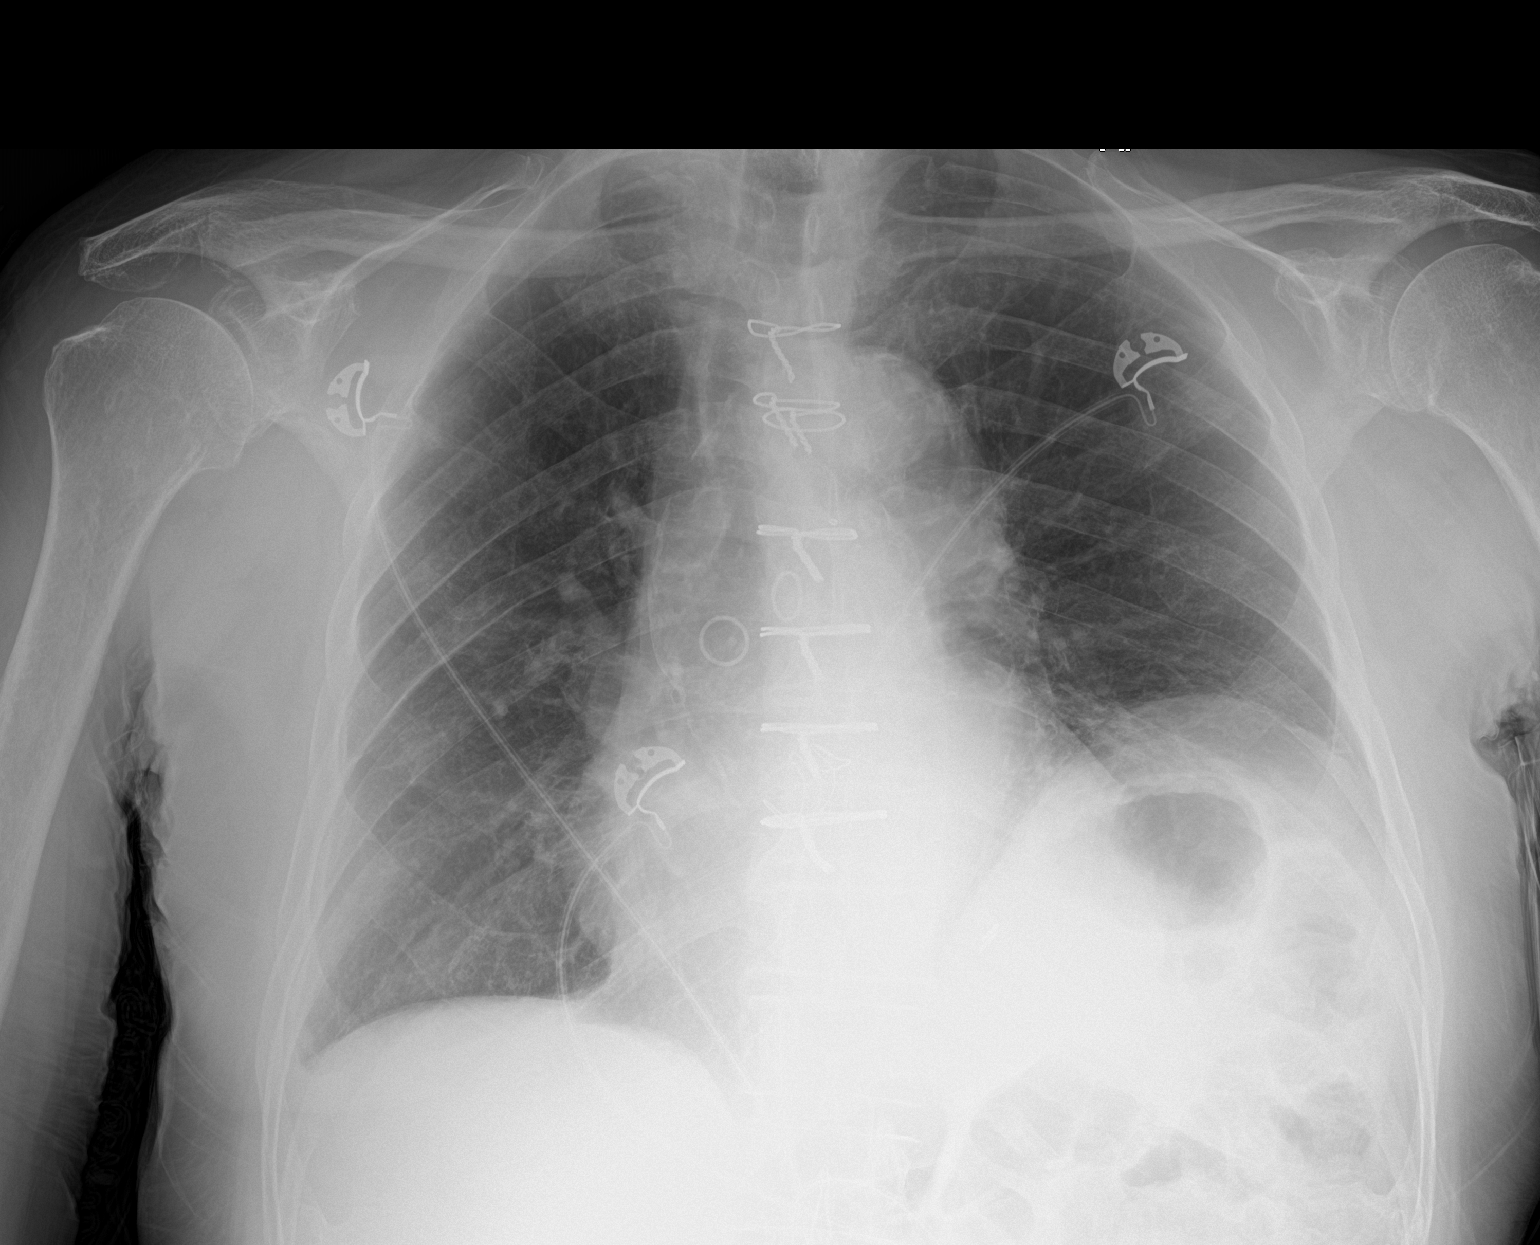

[1 of 1 positions shown; findings below may reference images not displayed]

FINDINGS: There is eventration of the left hemidiaphragm. No focal
consolidation, pleural effusion, pneumothorax. The cardiac
silhouette is within limits. Atherosclerotic calcification of the
aorta. Medial malleolus. No acute osseous pathology.
IMPRESSION: No active cardiopulmonary disease.

## 2023-09-09 ENCOUNTER — Emergency Department (HOSPITAL_BASED_OUTPATIENT_CLINIC_OR_DEPARTMENT_OTHER)

## 2023-09-09 ENCOUNTER — Other Ambulatory Visit: Payer: Self-pay

## 2023-09-09 ENCOUNTER — Encounter (HOSPITAL_BASED_OUTPATIENT_CLINIC_OR_DEPARTMENT_OTHER): Payer: Self-pay | Admitting: Emergency Medicine

## 2023-09-09 ENCOUNTER — Emergency Department (HOSPITAL_BASED_OUTPATIENT_CLINIC_OR_DEPARTMENT_OTHER)
Admission: EM | Admit: 2023-09-09 | Discharge: 2023-09-11 | Disposition: A | Discharge status: Other Acute Inpatient Hospital | Attending: Emergency Medicine | Admitting: Emergency Medicine

## 2023-09-09 DIAGNOSIS — Z7984 Long term (current) use of oral hypoglycemic drugs: Secondary | ICD-10-CM | POA: Insufficient documentation

## 2023-09-09 DIAGNOSIS — R109 Unspecified abdominal pain: Secondary | ICD-10-CM

## 2023-09-09 DIAGNOSIS — Z7982 Long term (current) use of aspirin: Secondary | ICD-10-CM | POA: Insufficient documentation

## 2023-09-09 DIAGNOSIS — R1011 Right upper quadrant pain: Secondary | ICD-10-CM | POA: Diagnosis present

## 2023-09-09 DIAGNOSIS — K838 Other specified diseases of biliary tract: Secondary | ICD-10-CM

## 2023-09-09 DIAGNOSIS — Z9049 Acquired absence of other specified parts of digestive tract: Secondary | ICD-10-CM | POA: Diagnosis not present

## 2023-09-09 DIAGNOSIS — R110 Nausea: Secondary | ICD-10-CM | POA: Diagnosis not present

## 2023-09-09 DIAGNOSIS — E119 Type 2 diabetes mellitus without complications: Secondary | ICD-10-CM | POA: Insufficient documentation

## 2023-09-09 DIAGNOSIS — Z79899 Other long term (current) drug therapy: Secondary | ICD-10-CM | POA: Diagnosis not present

## 2023-09-09 DIAGNOSIS — Z951 Presence of aortocoronary bypass graft: Secondary | ICD-10-CM | POA: Insufficient documentation

## 2023-09-09 LAB — CBC WITH DIFFERENTIAL/PLATELET
Abs Immature Granulocytes: 0.01 10*3/uL (ref 0.00–0.07)
Basophils Absolute: 0 10*3/uL (ref 0.0–0.1)
Basophils Relative: 0 %
Eosinophils Absolute: 0.2 10*3/uL (ref 0.0–0.5)
Eosinophils Relative: 3 %
HCT: 38.1 % — ABNORMAL LOW (ref 39.0–52.0)
Hemoglobin: 12.7 g/dL — ABNORMAL LOW (ref 13.0–17.0)
Immature Granulocytes: 0 %
Lymphocytes Relative: 24 %
Lymphs Abs: 1.8 10*3/uL (ref 0.7–4.0)
MCH: 31.5 pg (ref 26.0–34.0)
MCHC: 33.3 g/dL (ref 30.0–36.0)
MCV: 94.5 fL (ref 80.0–100.0)
Monocytes Absolute: 0.7 10*3/uL (ref 0.1–1.0)
Monocytes Relative: 9 %
Neutro Abs: 4.9 10*3/uL (ref 1.7–7.7)
Neutrophils Relative %: 64 %
Platelets: 95 10*3/uL — ABNORMAL LOW (ref 150–400)
RBC: 4.03 MIL/uL — ABNORMAL LOW (ref 4.22–5.81)
RDW: 14.6 % (ref 11.5–15.5)
WBC: 7.7 10*3/uL (ref 4.0–10.5)
nRBC: 0 % (ref 0.0–0.2)

## 2023-09-09 LAB — COMPREHENSIVE METABOLIC PANEL WITH GFR
ALT: 47 U/L — ABNORMAL HIGH (ref 0–44)
AST: 134 U/L — ABNORMAL HIGH (ref 15–41)
Albumin: 4 g/dL (ref 3.5–5.0)
Alkaline Phosphatase: 85 U/L (ref 38–126)
Anion gap: 8 (ref 5–15)
BUN: 13 mg/dL (ref 8–23)
CO2: 26 mmol/L (ref 22–32)
Calcium: 9.5 mg/dL (ref 8.9–10.3)
Chloride: 106 mmol/L (ref 98–111)
Creatinine, Ser: 1.42 mg/dL — ABNORMAL HIGH (ref 0.61–1.24)
GFR, Estimated: 46 mL/min — ABNORMAL LOW (ref >60)
Glucose, Bld: 137 mg/dL — ABNORMAL HIGH (ref 70–99)
Potassium: 4.6 mmol/L (ref 3.5–5.1)
Sodium: 140 mmol/L (ref 135–145)
Total Bilirubin: 0.8 mg/dL (ref 0.0–1.2)
Total Protein: 6.8 g/dL (ref 6.5–8.1)

## 2023-09-09 LAB — TROPONIN T, HIGH SENSITIVITY: Troponin T High Sensitivity: 25 ng/L — ABNORMAL HIGH (ref <19)

## 2023-09-09 LAB — LIPASE, BLOOD: Lipase: 47 U/L (ref 11–51)

## 2023-09-09 MED ORDER — MORPHINE SULFATE (PF) 4 MG/ML IV SOLN
4.0000 mg | Freq: Once | INTRAVENOUS | Status: AC
  Administered 2023-09-09: 4 mg via INTRAVENOUS
  Filled 2023-09-09: qty 1

## 2023-09-09 MED ORDER — SODIUM CHLORIDE 0.9 % IV BOLUS
500.0000 mL | Freq: Once | INTRAVENOUS | Status: AC
  Administered 2023-09-09: 500 mL via INTRAVENOUS

## 2023-09-09 MED ORDER — ONDANSETRON HCL 4 MG/2ML IJ SOLN
4.0000 mg | Freq: Once | INTRAMUSCULAR | Status: AC
Start: 2023-09-09 — End: 2023-09-09
  Administered 2023-09-09: 4 mg via INTRAVENOUS
  Filled 2023-09-09: qty 2

## 2023-09-09 NOTE — ED Notes (Signed)
 Called [facility]) to request for general surgery consult per EDP 11:24p

## 2023-09-09 NOTE — ED Triage Notes (Addendum)
 Per pt wife pt started with upper right abdominal pain, has moved to chest with nausea. Gall bladder removed in April.

## 2023-09-09 NOTE — ED Notes (Signed)
 This aide let pt family know that urine sample would be needed and provided a urinal. Advised family to use call light or they may assist pt if that was more comfortable.

## 2023-09-09 NOTE — ED Provider Notes (Signed)
 [place] [facility] Provider Note   CSN: [id number] Arrival date & time: 09/09/23  2116     History  Chief Complaint  Patient presents with   Abdominal Pain   Chest Pain    Norman Hart is a 89 y.o. male.  With a history of status post CABG, type 2 diabetes, aortic valve replacement and status post cholecystectomy who presents to ED for abdominal pain.  Patient underwent laparoscopic cholecystectomy on April 29 at [facility] (Dr.Arafeh).  Beginning around 3 PM today after eating a salad he began to experience right-sided abdominal pain which has persisted since the onset.  Also reports radiation to the right side of his chest as well.  Associated nausea no vomiting.  Last bowel movement was this morning was normal.  No fevers or chills.   Abdominal Pain Associated symptoms: chest pain   Chest Pain Associated symptoms: abdominal pain        Home Medications Prior to Admission medications   Medication Sig Start Date End Date Taking? Authorizing Provider  Acetaminophen  (TYLENOL ) 325 MG CAPS Take 1 tablet by mouth every 6 (six) hours as needed (pain).    [provider]  albuterol  (VENTOLIN  HFA) 108 (90 Base) MCG/ACT inhaler Inhale 2 puffs into the lungs every 4 (four) hours as needed. 12/02/19   [provider]  Alcohol Swabs (DROPSAFE ALCOHOL PREP) 70 % PADS Apply topically. 01/07/22   [provider]  amLODipine  (NORVASC ) 2.5 MG tablet Take 1 tablet (2.5 mg total) by mouth 2 (two) times daily. 02/10/22   Wynetta Heckle, MD  Aspirin  81 MG CAPS Take 81 mg by mouth daily. 12/20/20   [provider]  Blood Glucose Calibration (TRUE METRIX LEVEL 1) Low SOLN  01/07/22   [provider]  Blood Glucose Monitoring Suppl (TRUE METRIX AIR GLUCOSE METER) w/Device KIT  01/07/22   [provider]  carvedilol  (COREG ) 12.5 MG tablet Take 1 tablet (12.5 mg total) by mouth 2 (two) times  daily with a meal. 02/11/22   Wynetta Heckle, MD  donepezil  (ARICEPT ) 10 MG tablet Take 10 mg by mouth at bedtime.    [provider]  ipratropium (ATROVENT) 0.06 % nasal spray Place into the nose. Patient not taking: Reported on 02/09/2022 02/11/21 05/12/21  [provider]  latanoprost  (XALATAN ) 0.005 % ophthalmic solution Place 1 drop into both eyes nightly. 07/13/20   [provider]  metFORMIN (GLUCOPHAGE) 500 MG tablet Take 500 mg by mouth 2 (two) times daily with a meal. 03/04/15   [provider]  pantoprazole  (PROTONIX ) 40 MG tablet Take 1 tablet (40 mg total) by mouth daily. 02/11/22   Wynetta Heckle, MD  rosuvastatin  (CRESTOR ) 40 MG tablet Take 1 tablet by mouth at bedtime. 01/04/21   [provider]  TRUE METRIX BLOOD GLUCOSE TEST test strip SMARTSIG:Via Meter 01/07/22   [provider]  TRUEplus Lancets 33G MISC  01/07/22   [provider]      Allergies    Ivp dye [iodinated contrast media] and Lisinopril    Review of Systems   Review of Systems  Cardiovascular:  Positive for chest pain.  Gastrointestinal:  Positive for abdominal pain.    Physical Exam Updated Vital Signs BP (!) 166/52   Pulse 92   Temp (!) 97.4 F (36.3 C) (Oral)   Resp (!) 27   Ht 5\' 5"  (1.651 m)   Wt 66.7 kg  SpO2 100%   BMI 24.46 kg/m  Physical Exam Vitals and nursing note reviewed.  HENT:     Head: Normocephalic and atraumatic.  Eyes:     Pupils: Pupils are equal, round, and reactive to light.  Cardiovascular:     Rate and Rhythm: Normal rate and regular rhythm.  Pulmonary:     Effort: Pulmonary effort is normal.     Breath sounds: Normal breath sounds.  Abdominal:     Palpations: Abdomen is soft.     Tenderness: There is abdominal tenderness in the right upper quadrant.  Skin:    General: Skin is warm and dry.  Neurological:     Mental Status: He is alert.  Psychiatric:        Mood and Affect: Mood normal.     ED  Results / Procedures / Treatments   Labs (all labs ordered are listed, but only abnormal results are displayed) Labs Reviewed  COMPREHENSIVE METABOLIC PANEL WITH GFR - Abnormal; Notable for the following components:      Result Value   Glucose, Bld 137 (*)    Creatinine, Ser 1.42 (*)    AST 134 (*)    ALT 47 (*)    GFR, Estimated 46 (*)    All other components within normal limits  CBC WITH DIFFERENTIAL/PLATELET - Abnormal; Notable for the following components:   RBC 4.03 (*)    Hemoglobin 12.7 (*)    HCT 38.1 (*)    Platelets 95 (*)    All other components within normal limits  TROPONIN T, HIGH SENSITIVITY - Abnormal; Notable for the following components:   Troponin T High Sensitivity 25 (*)    All other components within normal limits  LIPASE, BLOOD  URINALYSIS, W/ REFLEX TO CULTURE (INFECTION SUSPECTED)  TROPONIN T, HIGH SENSITIVITY    EKG None  Radiology CT ABDOMEN PELVIS WO CONTRAST Result Date: 09/09/2023 CLINICAL DATA:  Right upper quadrant pain status post cholecystectomy 1 month ago. EXAM: CT ABDOMEN AND PELVIS WITHOUT CONTRAST TECHNIQUE: Multidetector CT imaging of the abdomen and pelvis was performed following the standard protocol without IV contrast. RADIATION DOSE REDUCTION: This exam was performed according to the departmental dose-optimization program which includes automated exposure control, adjustment of the mA and/or kV according to patient size and/or use of iterative reconstruction technique. COMPARISON:  CT abdomen pelvis and ultrasound of the abdomen for a 12/02/2023 and MRI abdomen 08/12/2023 FINDINGS: Lower chest: TAVR.  No acute abnormality. Hepatobiliary: Cholecystectomy. No fluid collection in the gallbladder fossa. Interval increase in intra and extrahepatic biliary dilation. The common bile duct measures 15 mm in diameter, previously 10 mm on 08/12/2023. No radiopaque stone. Pancreas: Unremarkable. No pancreatic ductal dilatation or surrounding  inflammatory changes. Spleen: Unremarkable. Adrenals/Urinary Tract: Normal adrenal glands. Bilateral nonobstructing nephrolithiasis. No hydronephrosis. Unremarkable bladder. Stomach/Bowel: Normal caliber large and small bowel. Moderate stool in the right colon. Stomach is within normal limits. The appendix is not visualized. Vascular/Lymphatic: Aortic atherosclerosis. No enlarged abdominal or pelvic lymph nodes. Reproductive: Unremarkable. Other: No free intraperitoneal fluid or air. Musculoskeletal: No acute fracture. IMPRESSION: 1. Interval increase in intra and extrahepatic biliary dilation. The common bile duct measures 15 mm in diameter, previously 10 mm on 08/12/2023. This may be due to reservoir effect post cholecystectomy. No radiopaque stone. Recommend correlation with LFTs. 2. Cholecystectomy. No fluid collection in the gallbladder fossa. 3. Bilateral nonobstructing nephrolithiasis. 4. Aortic Atherosclerosis (ICD10-I70.0). Electronically Signed   By: Rozell Cornet M.D.   On: 09/09/2023 22:49  DG Chest Portable 1 View Result Date: 09/09/2023 CLINICAL DATA:  Epigastric pain EXAM: PORTABLE CHEST 1 VIEW COMPARISON:  05/06/2022 FINDINGS: Post sternotomy changes and valve prosthesis. Chronic elevation of left diaphragm with atelectasis at the left base. Mild cardiomegaly. Aortic atherosclerosis. No pneumothorax IMPRESSION: Chronic elevation of left diaphragm with atelectasis at the left base. Mild cardiomegaly. Electronically Signed   By: Esmeralda Hedge M.D.   On: 09/09/2023 21:47    Procedures Procedures    Medications Ordered in ED Medications  morphine (PF) 4 MG/ML injection 4 mg (4 mg Intravenous Given 09/09/23 2300)  sodium chloride  0.9 % bolus 500 mL (500 mLs Intravenous New Bag/Given 09/09/23 2259)  ondansetron (ZOFRAN) injection 4 mg (4 mg Intravenous Given 09/09/23 2259)    ED Course/ Medical Decision Making/ A&P Clinical Course as of 09/09/23 2335  Wed Sep 09, 2023  2332 Laboratory  workup reveals high sensitive troponin of 25.  Will obtain delta troponin.  No ischemic changes on EKG.  CMP shows renal function at baseline with elevated AST and ALT.  No leukocytosis or significant anemia.  Platelet count at baseline.  CT abdomen pelvis shows Interval increase in intra and extrahepatic biliary dilation. The common bile duct measures 15 mm in diameter, previously 10 mm on04/30/2025. This may be due to reservoir effect postcholecystectomy. No radiopaque stone.   Will discuss these findings with patient's surgical team on-call from [facility] for further recommendations  Reevaluated patient.  He reports significant improvement in his discomfort following IV fluids Zofran and morphine here.  Teresia Fennel DO, am transitioning care of this patient to the oncoming provider pending discussion with patient's surgical team, delta troponin, reevaluation and disposition [MP]    Clinical Course User Index [MP] Sallyanne Creamer, DO                                 Medical Decision Making 89 year old male with history as above presenting for right-sided abdominal pain following recent cholecystectomy on April [facility] at [facility].  Symptoms began today after eating a salad.  Right upper quadrant tenderness on my exam.  Afebrile hypertensive here.  Given extensive cardiac history will evaluate for ACS with high-sensitivity troponin and EKG.  Will obtain laboratory workup to look for any evidence of electrolyte abnormality, leukocytosis or anemia.  Will also obtain CT abdomen pelvis without contrast (contrast allergy) to look for post cholecystectomy complications or other evidence of acute intra-abdominal process.  Will provide IV fluids, Zofran for nausea and morphine for pain control and reevaluate  Amount and/or Complexity of Data Reviewed Labs: ordered. Radiology: ordered.  Risk Prescription drug  management.           Final Clinical Impression(s) / ED Diagnoses Final diagnoses:  Abdominal pain, unspecified abdominal location  Status post laparoscopic cholecystectomy    Rx / DC Orders ED Discharge Orders     None         Sallyanne Creamer, DO 09/09/23 2335

## 2023-09-10 LAB — URINALYSIS, W/ REFLEX TO CULTURE (INFECTION SUSPECTED)
Bilirubin Urine: NEGATIVE
Glucose, UA: NEGATIVE mg/dL
Hgb urine dipstick: NEGATIVE
Ketones, ur: NEGATIVE mg/dL
Leukocytes,Ua: NEGATIVE
Nitrite: NEGATIVE
Protein, ur: NEGATIVE mg/dL
RBC / HPF: NONE SEEN RBC/hpf (ref 0–5)
Specific Gravity, Urine: 1.025 (ref 1.005–1.030)
pH: 6 (ref 5.0–8.0)

## 2023-09-10 LAB — TROPONIN T, HIGH SENSITIVITY: Troponin T High Sensitivity: 23 ng/L — ABNORMAL HIGH (ref <19)

## 2023-09-10 MED ORDER — PANTOPRAZOLE SODIUM 40 MG PO TBEC
40.0000 mg | DELAYED_RELEASE_TABLET | Freq: Every day | ORAL | Status: DC
  Administered 2023-09-10: 40 mg via ORAL
  Filled 2023-09-10: qty 1

## 2023-09-10 MED ORDER — CARVEDILOL 12.5 MG PO TABS
12.5000 mg | ORAL_TABLET | Freq: Two times a day (BID) | ORAL | Status: DC
  Administered 2023-09-10: 12.5 mg via ORAL
  Filled 2023-09-10: qty 1

## 2023-09-10 MED ORDER — ALBUTEROL SULFATE HFA 108 (90 BASE) MCG/ACT IN AERS: 1.0000 | INHALATION_SPRAY | Freq: Four times a day (QID) | RESPIRATORY_TRACT | Status: DC | PRN

## 2023-09-10 MED ORDER — AMLODIPINE BESYLATE 5 MG PO TABS
2.5000 mg | ORAL_TABLET | Freq: Two times a day (BID) | ORAL | Status: DC
  Administered 2023-09-10: 2.5 mg via ORAL
  Filled 2023-09-10: qty 1

## 2023-09-10 NOTE — ED Notes (Signed)
 At 8:30p [facility] admitting called with room # gave info to nurse Angus Bark  [facility] is handling transport

## 2023-09-10 NOTE — ED Notes (Signed)
 Called [facility] [phone] Stated they patient is on a wait list for 12-24 hours starting at 2 am 09/10/23

## 2023-09-10 NOTE — ED Provider Notes (Addendum)
 Pt continues to await txfr to [facility] No ETA on bed at this time Pain well controlled here, HDS Spoke w/ pt and family at length at bedside regarding current plan All questions answered, pt reports overall he is actually feeling much better Continue to board awaiting txfr to [facility]   Teddi Favors, DO 09/10/23 1904   8:05 PM  Update, received callback from transfer life, pt has bed available.  Plan to transfer patient to [facility] for further evaluation and management of ?retained gallstone. Reason for transfer: continuity of care. Discussed findings and plan with patient or guardian. Patient or guardian agrees with plan and transfer. Discussed the patient with accepting facility transfer team and with accepting physician, Dr. Acie Acosta, updated on patient condition. EMTALA form completed and signed. Patient will be sent with paperwork and imaging, if done, from today's visit.    Teddi Favors, DO 09/10/23 2005

## 2023-09-10 NOTE — ED Provider Notes (Signed)
  Provider Note MRN:  [id number]  Arrival date & time: 09/10/23    ED Course and Medical Decision Making  Assumed care of patient at sign-out or upon transfer.  Right upper quadrant pain, recent cholecystectomy, mild LFT elevation, some dilation of common bile duct, plan is to talk to his surgery team, [facility].  Will reassess.  Also second Trope.  Procedures  Final Clinical Impressions(s) / ED Diagnoses     ICD-10-CM   1. Abdominal pain, unspecified abdominal location  R10.9     2. Status post laparoscopic cholecystectomy  Z90.49       ED Discharge Orders     None       Discharge Instructions   None     Merrick Abe. Harless Lien, MD [facility] [email or web address]

## 2023-09-10 NOTE — ED Notes (Signed)
 Called [facility]  Still waiting on a bed to be ready [facility] will call when bed ready

## 2023-09-10 NOTE — ED Notes (Signed)
 Spoke with [facility] to request physician-to-physician consult for possible admit to medicine 12:51a

## 2023-09-11 DIAGNOSIS — Z743 Need for continuous supervision: Secondary | ICD-10-CM | POA: Diagnosis not present

## 2023-09-11 DIAGNOSIS — I959 Hypotension, unspecified: Secondary | ICD-10-CM | POA: Diagnosis not present

## 2023-09-11 DIAGNOSIS — R1011 Right upper quadrant pain: Secondary | ICD-10-CM | POA: Diagnosis not present
